# Patient Record
Sex: Female | Born: 1940 | Race: White | Hispanic: No | Marital: Single | Smoking: Current some day smoker
Health system: Southern US, Community
[De-identification: ages and names within clinical notes are randomized; demographics above are authoritative.]

## PROBLEM LIST (undated history)

## (undated) DIAGNOSIS — J449 Chronic obstructive pulmonary disease, unspecified: Secondary | ICD-10-CM

## (undated) HISTORY — PX: ABDOMINAL HYSTERECTOMY: SHX81

---

## 2010-04-18 ENCOUNTER — Emergency Department: Payer: Self-pay | Admitting: Emergency Medicine

## 2013-03-19 ENCOUNTER — Emergency Department: Payer: Self-pay | Admitting: Internal Medicine

## 2013-04-05 ENCOUNTER — Ambulatory Visit: Payer: Self-pay | Admitting: Ophthalmology

## 2013-04-05 DIAGNOSIS — I499 Cardiac arrhythmia, unspecified: Secondary | ICD-10-CM

## 2013-08-07 ENCOUNTER — Ambulatory Visit: Payer: Self-pay | Admitting: Ophthalmology

## 2013-08-24 ENCOUNTER — Ambulatory Visit: Payer: Self-pay | Admitting: Ophthalmology

## 2013-08-24 LAB — POTASSIUM: POTASSIUM: 3.9 mmol/L (ref 3.5–5.1)

## 2013-08-29 ENCOUNTER — Ambulatory Visit: Payer: Self-pay | Admitting: Ophthalmology

## 2014-10-06 NOTE — Op Note (Signed)
PATIENT NAME:  Michelle Shaffer, Michelle Shaffer MR#:  [id number] DATE OF BIRTH:  [birth year]  DATE OF PROCEDURE:  08/07/2013  PREOPERATIVE DIAGNOSIS: Visually significant cataract of the right eye.   POSTOPERATIVE DIAGNOSIS: Visually significant cataract of the right eye.   OPERATIVE PROCEDURE: Cataract extraction by phacoemulsification with implant of intraocular lens to right eye.   SURGEON: Galen ManilaWilliam Suresh Audi, MD.   ANESTHESIA:  1. Managed anesthesia care.  2. Topical tetracaine drops followed by 2% Xylocaine jelly applied in the preoperative holding area.   COMPLICATIONS: None.   TECHNIQUE:  Stop and chop.   DESCRIPTION OF PROCEDURE: The patient was examined and consented in the preoperative holding area where the aforementioned topical anesthesia was applied to the right eye and then brought back to the Operating Room where the right eye was prepped and draped in the usual sterile ophthalmic fashion and a lid speculum was placed. A paracentesis was created with the side port blade and the anterior chamber was filled with viscoelastic. A near clear corneal incision was performed with the steel keratome. A continuous curvilinear capsulorrhexis was performed with a cystotome followed by the capsulorrhexis forceps. Hydrodissection and hydrodelineation were carried out with BSS on a blunt cannula. The lens was removed in a stop and chop technique and the remaining cortical material was removed with the irrigation-aspiration handpiece. The capsular bag was inflated with viscoelastic and the Tecnis ZCB00 23.0-diopter lens, serial number [id number] was placed in the capsular bag without complication. The remaining viscoelastic was removed from the eye with the irrigation-aspiration handpiece. The wounds were hydrated. The anterior chamber was flushed with Miostat and the eye was inflated to physiologic pressure. 0.1 mL of cefuroxime concentration 10 mg/mL was placed in the anterior chamber. The wounds were found to be  water tight. The eye was dressed with Vigamox. The patient was given protective glasses to wear throughout the day and a shield with which to sleep tonight. The patient was also given drops with which to begin a drop regimen today and will follow-up with me in one day.    ____________________________ Jerilee FieldWilliam L. Lovina Zuver, MD wlp:sb D: 08/07/2013 11:39:43 ET T: 08/07/2013 12:21:42 ET JOB#: [id number]  cc: Iyani Dresner L. Nozomi Mettler, MD, <Dictator> Jerilee FieldWILLIAM L Zendayah Hardgrave MD ELECTRONICALLY SIGNED 08/11/2013 16:10

## 2014-10-06 NOTE — Op Note (Signed)
PATIENT NAME:  Michelle CaulWHITE, Haifa R MR#:  [id number] DATE OF BIRTH:  1941-04-13  DATE OF PROCEDURE:  08/29/2013  PREOPERATIVE DIAGNOSIS: Visually significant cataract of the left eye.   POSTOPERATIVE DIAGNOSIS: Visually significant cataract of the left eye.   OPERATIVE PROCEDURE: Cataract extraction by phacoemulsification with implant of intraocular lens to the left eye.   SURGEON: Galen ManilaWilliam Elizjah Noblet, MD.   ANESTHESIA:  1. Managed anesthesia care.  2. Topical tetracaine drops followed by 2% Xylocaine jelly applied in the preoperative holding area.   COMPLICATIONS: None.   TECHNIQUE:  Stop and chop.    DESCRIPTION OF PROCEDURE: The patient was examined and consented in the preoperative holding area where the aforementioned topical anesthesia was applied to the left eye and then brought back to the Operating Room where the left eye was prepped and draped in the usual sterile ophthalmic fashion and a lid speculum was placed. A paracentesis was created with the side port blade and the anterior chamber was filled with viscoelastic. A near clear corneal incision was performed with the steel keratome. A continuous curvilinear capsulorrhexis was performed with a cystotome followed by the capsulorrhexis forceps. Hydrodissection and hydrodelineation were carried out with BSS on a blunt cannula. The lens was removed in a stop and chop technique and the remaining cortical material was removed with the irrigation-aspiration handpiece. The capsular bag was inflated with viscoelastic and the Tecnis ZCB00 23.5-diopter lens, serial number [id number] was placed in the capsular bag without complication. The remaining viscoelastic was removed from the eye with the irrigation-aspiration handpiece. The wounds were hydrated. The anterior chamber was flushed with Miostat and the eye was inflated to physiologic pressure. 0.1 mL of cefuroxime concentration 10 mg/mL was placed in the anterior chamber. The wounds were found to be  water tight. The eye was dressed with Vigamox. The patient was given protective glasses to wear throughout the day and a shield with which to sleep tonight. The patient was also given drops with which to begin a drop regimen today and will follow-up with me in one day.   ____________________________ Jerilee FieldWilliam L. Ashlyne Olenick, MD wlp:gb D: 08/29/2013 22:47:42 ET T: 08/30/2013 04:59:23 ET JOB#: [id number]  cc: Wallace Gappa L. Zanaria Morell, MD, <Dictator> Jerilee FieldWILLIAM L Valmai Vandenberghe MD ELECTRONICALLY SIGNED 08/31/2013 9:11

## 2015-01-20 IMAGING — CR DG ANKLE COMPLETE 3+V*L*
1 series · 5 of 5 positions shown · non-contrast
Comparison: none

REASON FOR EXAM: Left ankle pain
COMMENTS:

PROCEDURE:     DXR - DXR ANKLE LEFT COMPLETE  - March 19, 2013  [DATE]
RESULT:     History: Pain.
Comparison Study: No prior. Soft tissue structures are normal. Diffuse
osteopenia. No acute bony abnormality.

[Series 4: x ankle ap left · 0.14mm/px · 5 of 5 slices shown]
[im 1/5]
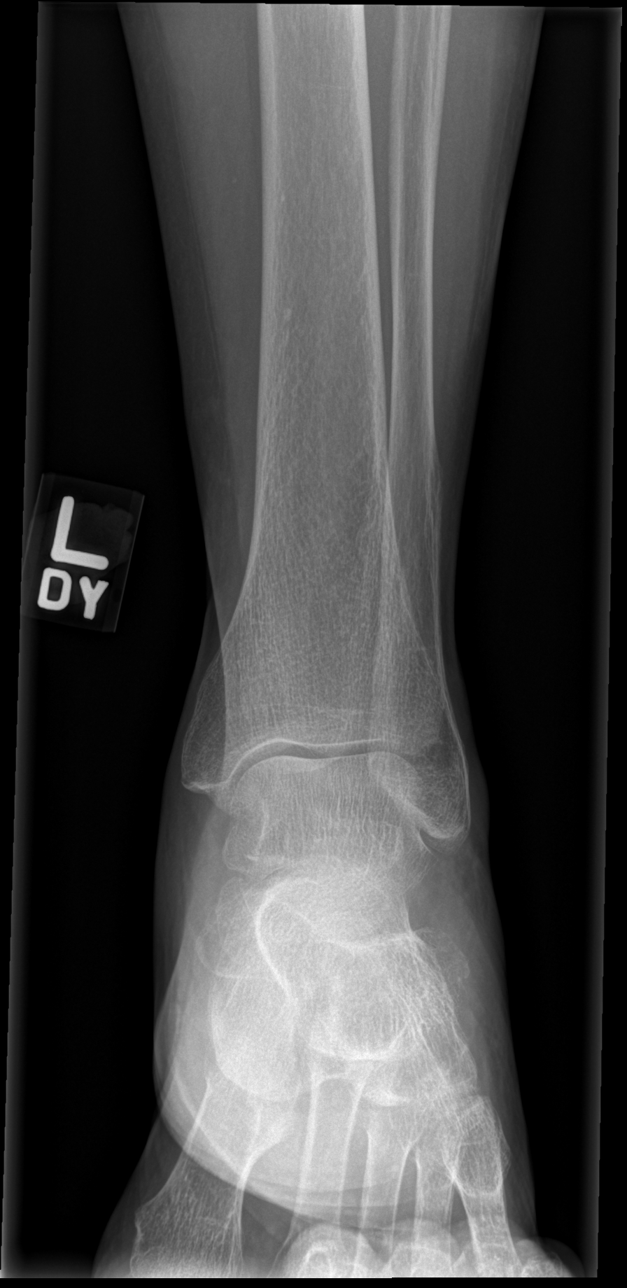
[im 2/5]
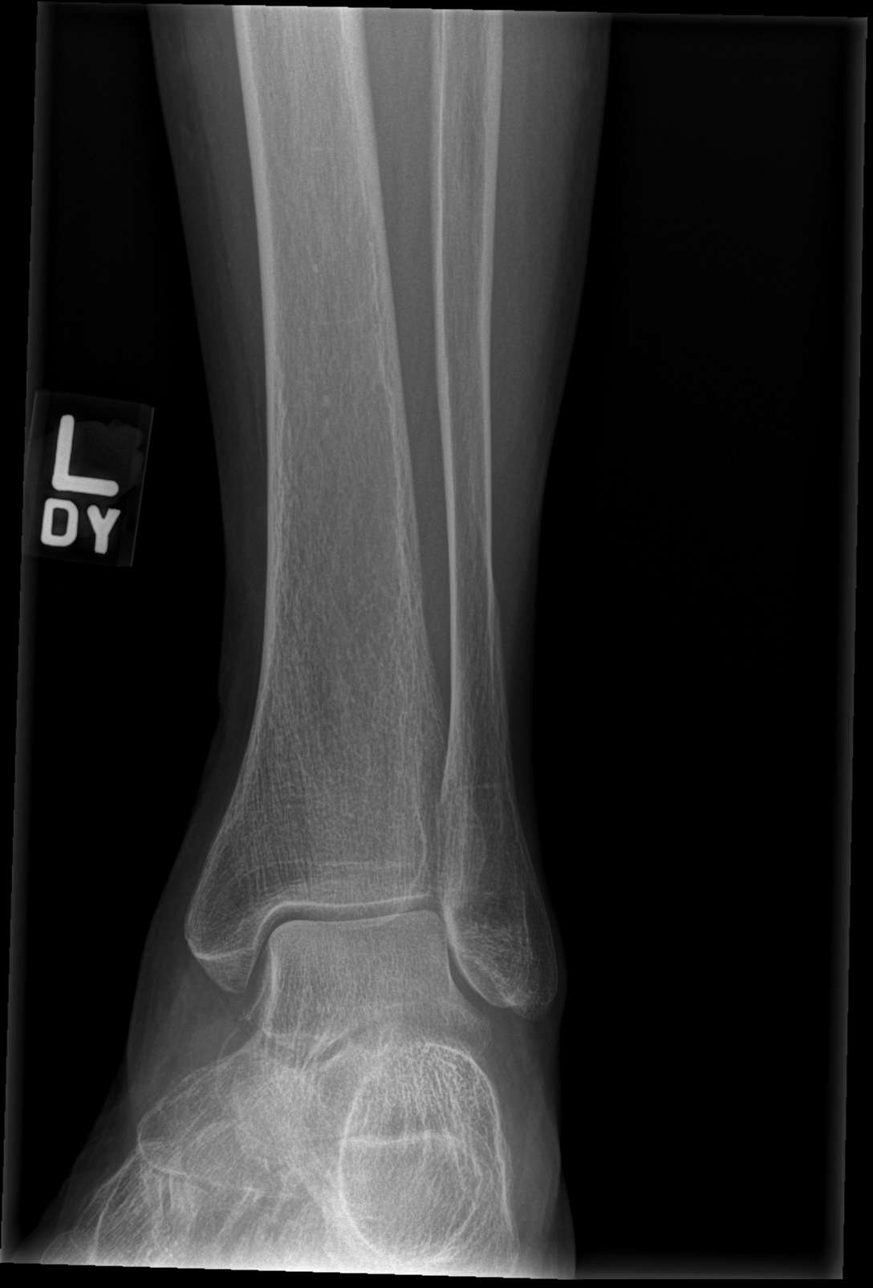
[im 3/5]
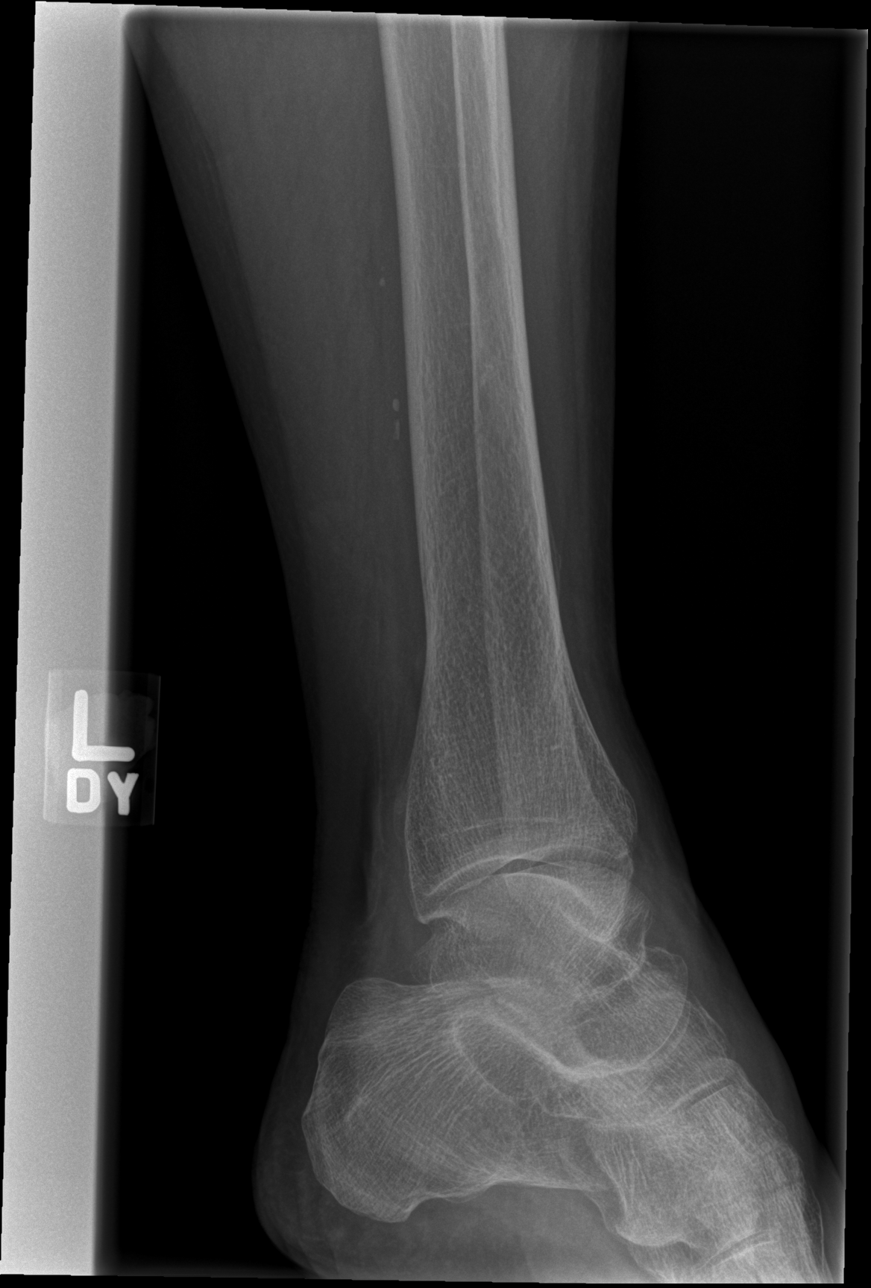
[im 4/5]
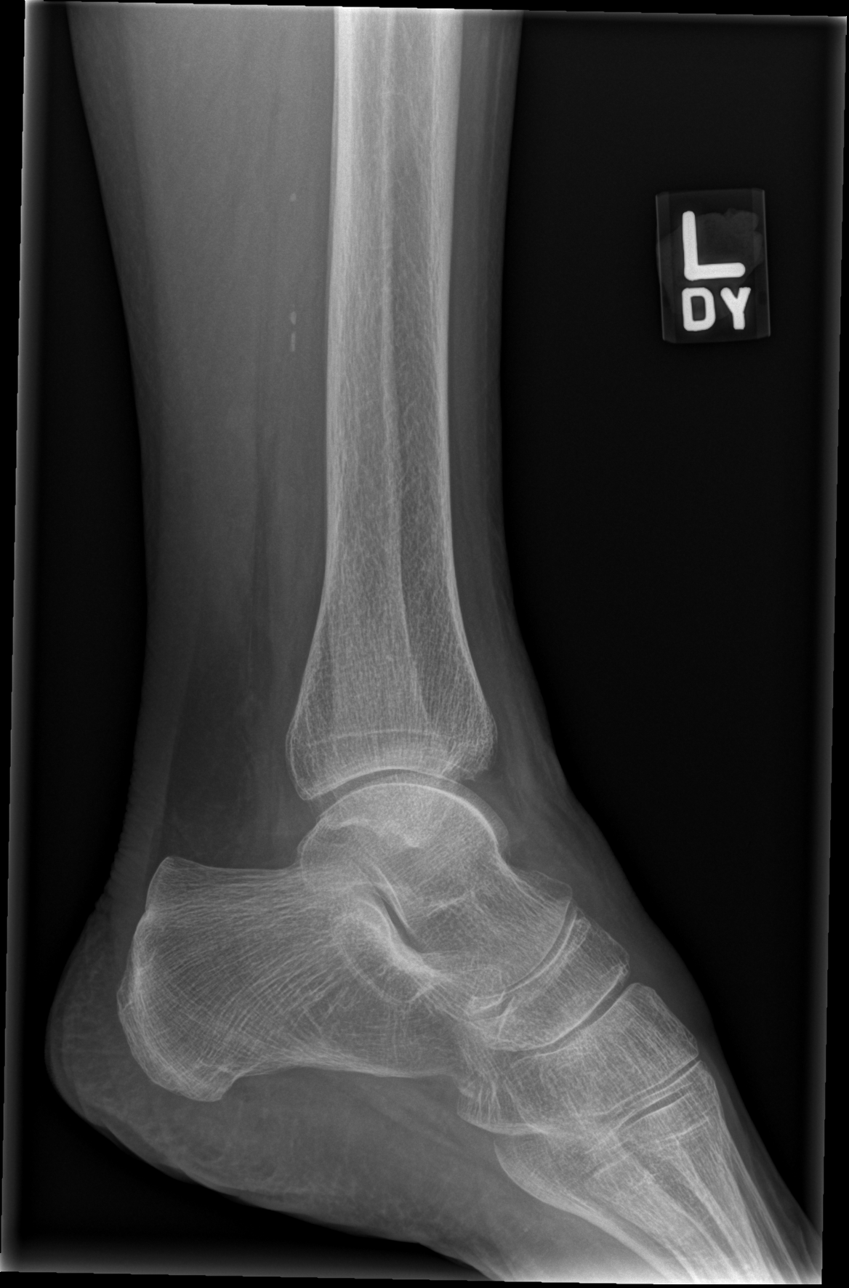
[im 5/5]
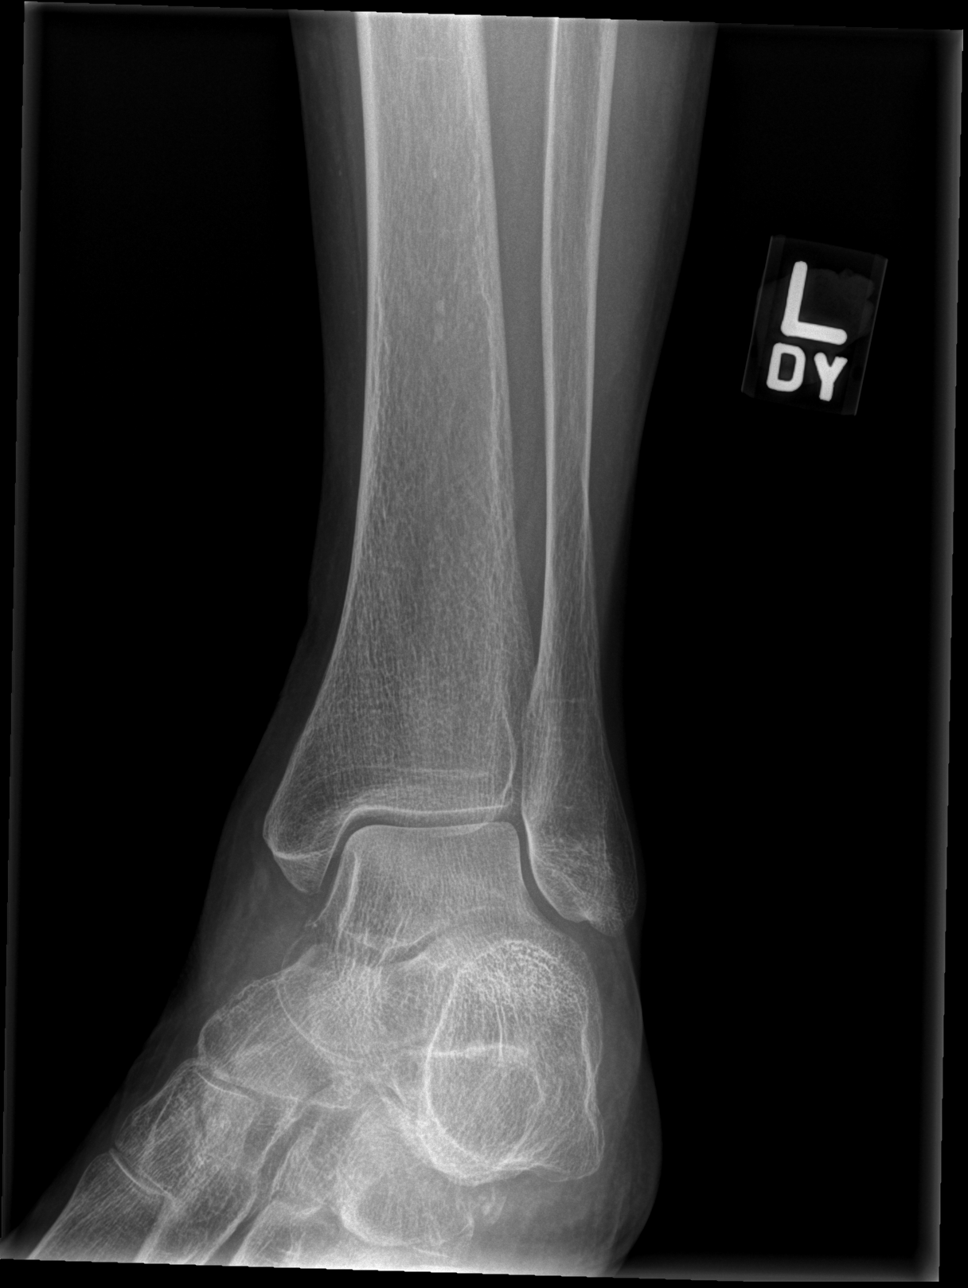

[5 of 5 positions shown; findings below may reference images not displayed]

IMPRESSION: No acute abnormality. Diffuse osteopenia.

## 2016-11-23 ENCOUNTER — Ambulatory Visit: Payer: Self-pay | Admitting: Podiatry

## 2016-12-03 ENCOUNTER — Ambulatory Visit: Payer: Self-pay | Admitting: Podiatry

## 2016-12-21 ENCOUNTER — Ambulatory Visit: Payer: Self-pay | Admitting: Podiatry

## 2016-12-24 ENCOUNTER — Ambulatory Visit (INDEPENDENT_AMBULATORY_CARE_PROVIDER_SITE_OTHER): Payer: Medicare PPO | Admitting: Podiatry

## 2016-12-24 ENCOUNTER — Encounter: Payer: Self-pay | Admitting: Podiatry

## 2016-12-24 VITALS — BP 171/90 | HR 81

## 2016-12-24 DIAGNOSIS — Q828 Other specified congenital malformations of skin: Secondary | ICD-10-CM

## 2016-12-24 DIAGNOSIS — M79676 Pain in unspecified toe(s): Secondary | ICD-10-CM

## 2016-12-24 DIAGNOSIS — B351 Tinea unguium: Secondary | ICD-10-CM

## 2016-12-24 NOTE — Progress Notes (Signed)
   Subjective:    Patient ID: Michelle Shaffer, female    DOB: 1940-08-06, 76 y.o.   MRN: [id number]  HPI this patient presents the office with chief complaint of painful long nails as well as a painful callus on the bottom of her right forefoot.  She says she was referred to this office by her medical doctor for tear of her feet.  She says the nails and the callus is painful walking and wearing her shoes.  She presents the office today for preventative foot care services.    Review of Systems  Eyes: Positive for redness and itching.  Cardiovascular: Positive for leg swelling.       Calf pain with walking  Musculoskeletal: Positive for back pain and gait problem.       Joint pain  Skin:       Change in nails       Objective:   Physical Exam GENERAL APPEARANCE: Alert, conversant. Appropriately groomed. No acute distress.  VASCULAR: Pedal pulses are  [facility].  Capillary refill time is immediate to all digits,  Normal temperature gradient.  Digital hair growth is present bilateral  NEUROLOGIC: sensation is normal to 5.07 monofilament at 5/5 sites bilateral.  Light touch is intact bilateral, Muscle strength normal.  MUSCULOSKELETAL: acceptable muscle strength, tone and stability bilateral.  Intrinsic muscluature intact bilateral.  Rectus appearance of foot and digits noted bilateral.  NAILS  thick disfigured discolored nails with subungual debris noted 10 DERMATOLOGIC: skin color, texture, and turgor are within normal limits.  No preulcerative lesions or ulcers  are seen, no interdigital maceration noted.  No open lesions present.   No drainage noted. Porokeratosis sub 5th right.,        Assessment & Plan:  Onychomycosis  Porokeratosis right foot.   IE  Debride nails.  Debride porokeratosis.  RTC 3 months.   Helane GuntherGregory Alvino Lechuga DPM

## 2019-12-13 ENCOUNTER — Ambulatory Visit: Payer: Self-pay

## 2020-02-12 ENCOUNTER — Ambulatory Visit: Payer: Medicare Other | Attending: Internal Medicine

## 2020-02-12 DIAGNOSIS — Z23 Encounter for immunization: Secondary | ICD-10-CM

## 2020-02-12 NOTE — Progress Notes (Signed)
   Covid-19 Vaccination Clinic  Name:  Michelle Shaffer    MRN: [id number] DOB: 1941/03/09  02/12/2020  Michelle Shaffer was observed post Covid-19 immunization for 15 minutes without incident. She was provided with Vaccine Information Sheet and instruction to access the V-Safe system.   Michelle Shaffer was instructed to call 911 with any severe reactions post vaccine: Marland Kitchen Difficulty breathing  . Swelling of face and throat  . A fast heartbeat  . A bad rash all over body  . Dizziness and weakness

## 2022-03-26 ENCOUNTER — Emergency Department: Payer: Medicare Other

## 2022-03-26 ENCOUNTER — Other Ambulatory Visit: Payer: Self-pay

## 2022-03-26 ENCOUNTER — Encounter: Payer: Self-pay | Admitting: Emergency Medicine

## 2022-03-26 ENCOUNTER — Inpatient Hospital Stay
Admission: EM | Admit: 2022-03-26 | Discharge: 2022-03-31 | DRG: 189 | Disposition: A | Discharge status: Home Health | Payer: Medicare Other | Attending: Internal Medicine | Admitting: Internal Medicine

## 2022-03-26 DIAGNOSIS — Z90710 Acquired absence of both cervix and uterus: Secondary | ICD-10-CM

## 2022-03-26 DIAGNOSIS — D72829 Elevated white blood cell count, unspecified: Secondary | ICD-10-CM | POA: Diagnosis present

## 2022-03-26 DIAGNOSIS — Z9981 Dependence on supplemental oxygen: Secondary | ICD-10-CM

## 2022-03-26 DIAGNOSIS — J441 Chronic obstructive pulmonary disease with (acute) exacerbation: Secondary | ICD-10-CM | POA: Diagnosis not present

## 2022-03-26 DIAGNOSIS — Z886 Allergy status to analgesic agent status: Secondary | ICD-10-CM

## 2022-03-26 DIAGNOSIS — K219 Gastro-esophageal reflux disease without esophagitis: Secondary | ICD-10-CM | POA: Diagnosis present

## 2022-03-26 DIAGNOSIS — F419 Anxiety disorder, unspecified: Secondary | ICD-10-CM | POA: Diagnosis present

## 2022-03-26 DIAGNOSIS — Z79899 Other long term (current) drug therapy: Secondary | ICD-10-CM

## 2022-03-26 DIAGNOSIS — J9621 Acute and chronic respiratory failure with hypoxia: Principal | ICD-10-CM | POA: Diagnosis present

## 2022-03-26 DIAGNOSIS — F17210 Nicotine dependence, cigarettes, uncomplicated: Secondary | ICD-10-CM | POA: Diagnosis present

## 2022-03-26 DIAGNOSIS — J431 Panlobular emphysema: Secondary | ICD-10-CM | POA: Diagnosis present

## 2022-03-26 DIAGNOSIS — E669 Obesity, unspecified: Secondary | ICD-10-CM | POA: Diagnosis present

## 2022-03-26 DIAGNOSIS — F17200 Nicotine dependence, unspecified, uncomplicated: Secondary | ICD-10-CM | POA: Diagnosis present

## 2022-03-26 DIAGNOSIS — G629 Polyneuropathy, unspecified: Secondary | ICD-10-CM | POA: Diagnosis present

## 2022-03-26 DIAGNOSIS — J9601 Acute respiratory failure with hypoxia: Secondary | ICD-10-CM

## 2022-03-26 DIAGNOSIS — Z6830 Body mass index (BMI) 30.0-30.9, adult: Secondary | ICD-10-CM

## 2022-03-26 DIAGNOSIS — F32A Depression, unspecified: Secondary | ICD-10-CM | POA: Diagnosis present

## 2022-03-26 DIAGNOSIS — R000 Tachycardia, unspecified: Secondary | ICD-10-CM | POA: Diagnosis present

## 2022-03-26 DIAGNOSIS — Z20822 Contact with and (suspected) exposure to covid-19: Secondary | ICD-10-CM | POA: Diagnosis present

## 2022-03-26 DIAGNOSIS — I10 Essential (primary) hypertension: Secondary | ICD-10-CM | POA: Diagnosis present

## 2022-03-26 HISTORY — DX: Chronic obstructive pulmonary disease, unspecified: J44.9

## 2022-03-26 LAB — URINALYSIS, ROUTINE W REFLEX MICROSCOPIC
Bacteria, UA: NONE SEEN
Bilirubin Urine: NEGATIVE
Glucose, UA: NEGATIVE mg/dL
Hgb urine dipstick: NEGATIVE
Ketones, ur: 5 mg/dL — AB
Leukocytes,Ua: NEGATIVE
Nitrite: NEGATIVE
Protein, ur: >=300 mg/dL — AB
Specific Gravity, Urine: 1.034 — ABNORMAL HIGH (ref 1.005–1.030)
pH: 5 (ref 5.0–8.0)

## 2022-03-26 LAB — CBC WITH DIFFERENTIAL/PLATELET
Abs Immature Granulocytes: 0.03 10*3/uL (ref 0.00–0.07)
Basophils Absolute: 0.1 10*3/uL (ref 0.0–0.1)
Basophils Relative: 1 %
Eosinophils Absolute: 0.6 10*3/uL — ABNORMAL HIGH (ref 0.0–0.5)
Eosinophils Relative: 4 %
HCT: 50.9 % — ABNORMAL HIGH (ref 36.0–46.0)
Hemoglobin: 16.1 g/dL — ABNORMAL HIGH (ref 12.0–15.0)
Immature Granulocytes: 0 %
Lymphocytes Relative: 12 %
Lymphs Abs: 1.7 10*3/uL (ref 0.7–4.0)
MCH: 29.6 pg (ref 26.0–34.0)
MCHC: 31.6 g/dL (ref 30.0–36.0)
MCV: 93.6 fL (ref 80.0–100.0)
Monocytes Absolute: 1 10*3/uL (ref 0.1–1.0)
Monocytes Relative: 6 %
Neutro Abs: 11.5 10*3/uL — ABNORMAL HIGH (ref 1.7–7.7)
Neutrophils Relative %: 77 %
Platelets: 265 10*3/uL (ref 150–400)
RBC: 5.44 MIL/uL — ABNORMAL HIGH (ref 3.87–5.11)
RDW: 14.6 % (ref 11.5–15.5)
WBC: 14.9 10*3/uL — ABNORMAL HIGH (ref 4.0–10.5)
nRBC: 0 % (ref 0.0–0.2)

## 2022-03-26 LAB — RESP PANEL BY RT-PCR (FLU A&B, COVID) ARPGX2
Influenza A by PCR: NEGATIVE
Influenza B by PCR: NEGATIVE
SARS Coronavirus 2 by RT PCR: NEGATIVE

## 2022-03-26 LAB — COMPREHENSIVE METABOLIC PANEL
ALT: 11 U/L (ref 0–44)
AST: 25 U/L (ref 15–41)
Albumin: 3.9 g/dL (ref 3.5–5.0)
Alkaline Phosphatase: 38 U/L (ref 38–126)
Anion gap: 8 (ref 5–15)
BUN: 9 mg/dL (ref 8–23)
CO2: 22 mmol/L (ref 22–32)
Calcium: 8.6 mg/dL — ABNORMAL LOW (ref 8.9–10.3)
Chloride: 109 mmol/L (ref 98–111)
Creatinine, Ser: 0.84 mg/dL (ref 0.44–1.00)
GFR, Estimated: >60 mL/min (ref >60)
Glucose, Bld: 160 mg/dL — ABNORMAL HIGH (ref 70–99)
Potassium: 3.7 mmol/L (ref 3.5–5.1)
Sodium: 139 mmol/L (ref 135–145)
Total Bilirubin: 1 mg/dL (ref 0.3–1.2)
Total Protein: 7.1 g/dL (ref 6.5–8.1)

## 2022-03-26 LAB — TROPONIN I (HIGH SENSITIVITY)
Troponin I (High Sensitivity): 5 ng/L (ref <18)
Troponin I (High Sensitivity): 6 ng/L (ref <18)

## 2022-03-26 LAB — PROCALCITONIN: Procalcitonin: <0.1 ng/mL

## 2022-03-26 LAB — MAGNESIUM: Magnesium: 2 mg/dL (ref 1.7–2.4)

## 2022-03-26 LAB — BRAIN NATRIURETIC PEPTIDE: B Natriuretic Peptide: 27.8 pg/mL (ref 0.0–100.0)

## 2022-03-26 MED ORDER — CLONAZEPAM 0.5 MG PO TABS
0.5000 mg | ORAL_TABLET | Freq: Three times a day (TID) | ORAL | Status: DC | PRN
  Administered 2022-03-26 – 2022-03-29 (×5): 0.5 mg via ORAL
  Filled 2022-03-26 (×5): qty 1

## 2022-03-26 MED ORDER — AMLODIPINE BESYLATE 5 MG PO TABS
5.0000 mg | ORAL_TABLET | Freq: Every day | ORAL | Status: DC
  Administered 2022-03-26 – 2022-03-31 (×6): 5 mg via ORAL
  Filled 2022-03-26 (×6): qty 1

## 2022-03-26 MED ORDER — PANTOPRAZOLE SODIUM 40 MG PO TBEC
40.0000 mg | DELAYED_RELEASE_TABLET | Freq: Every day | ORAL | Status: DC
  Administered 2022-03-26 – 2022-03-31 (×6): 40 mg via ORAL
  Filled 2022-03-26 (×6): qty 1

## 2022-03-26 MED ORDER — ALBUTEROL SULFATE (2.5 MG/3ML) 0.083% IN NEBU: 2.5000 mg | INHALATION_SOLUTION | RESPIRATORY_TRACT | Status: DC | PRN

## 2022-03-26 MED ORDER — ACETAMINOPHEN 325 MG PO TABS
650.0000 mg | ORAL_TABLET | Freq: Four times a day (QID) | ORAL | Status: DC | PRN
  Administered 2022-03-28 – 2022-03-29 (×3): 650 mg via ORAL
  Filled 2022-03-26 (×4): qty 2

## 2022-03-26 MED ORDER — METHYLPREDNISOLONE SODIUM SUCC 40 MG IJ SOLR
40.0000 mg | Freq: Two times a day (BID) | INTRAMUSCULAR | Status: AC
  Administered 2022-03-26 (×2): 40 mg via INTRAVENOUS
  Filled 2022-03-26 (×2): qty 1

## 2022-03-26 MED ORDER — ONDANSETRON HCL 4 MG PO TABS: 4.0000 mg | ORAL_TABLET | Freq: Four times a day (QID) | ORAL | Status: DC | PRN

## 2022-03-26 MED ORDER — NICOTINE 14 MG/24HR TD PT24
14.0000 mg | MEDICATED_PATCH | Freq: Every day | TRANSDERMAL | Status: DC
  Administered 2022-03-26 – 2022-03-30 (×5): 14 mg via TRANSDERMAL
  Filled 2022-03-26 (×6): qty 1

## 2022-03-26 MED ORDER — LISINOPRIL 20 MG PO TABS
20.0000 mg | ORAL_TABLET | Freq: Every day | ORAL | Status: DC
  Administered 2022-03-26 – 2022-03-31 (×6): 20 mg via ORAL
  Filled 2022-03-26: qty 2
  Filled 2022-03-26 (×4): qty 1

## 2022-03-26 MED ORDER — METHYLPREDNISOLONE SODIUM SUCC 125 MG IJ SOLR: 125.0000 mg | Freq: Once | INTRAMUSCULAR | Status: DC

## 2022-03-26 MED ORDER — ACETAMINOPHEN 650 MG RE SUPP: 650.0000 mg | Freq: Four times a day (QID) | RECTAL | Status: DC | PRN

## 2022-03-26 MED ORDER — IPRATROPIUM-ALBUTEROL 0.5-2.5 (3) MG/3ML IN SOLN
3.0000 mL | Freq: Four times a day (QID) | RESPIRATORY_TRACT | Status: DC
  Administered 2022-03-26 – 2022-03-31 (×23): 3 mL via RESPIRATORY_TRACT
  Filled 2022-03-26 (×22): qty 3

## 2022-03-26 MED ORDER — TRAZODONE HCL 50 MG PO TABS
25.0000 mg | ORAL_TABLET | Freq: Every day | ORAL | Status: DC
  Administered 2022-03-26 – 2022-03-30 (×5): 25 mg via ORAL
  Filled 2022-03-26 (×5): qty 1

## 2022-03-26 MED ORDER — GABAPENTIN 300 MG PO CAPS
300.0000 mg | ORAL_CAPSULE | Freq: Three times a day (TID) | ORAL | Status: DC
  Administered 2022-03-26 – 2022-03-31 (×15): 300 mg via ORAL
  Filled 2022-03-26 (×15): qty 1

## 2022-03-26 MED ORDER — PREDNISONE 20 MG PO TABS
40.0000 mg | ORAL_TABLET | Freq: Every day | ORAL | Status: DC
  Administered 2022-03-27: 40 mg via ORAL
  Filled 2022-03-26: qty 2

## 2022-03-26 MED ORDER — IPRATROPIUM-ALBUTEROL 0.5-2.5 (3) MG/3ML IN SOLN
6.0000 mL | Freq: Once | RESPIRATORY_TRACT | Status: AC
  Administered 2022-03-26: 6 mL via RESPIRATORY_TRACT
  Filled 2022-03-26: qty 6

## 2022-03-26 MED ORDER — ENOXAPARIN SODIUM 40 MG/0.4ML IJ SOSY
40.0000 mg | PREFILLED_SYRINGE | INTRAMUSCULAR | Status: DC
  Administered 2022-03-26 – 2022-03-31 (×6): 40 mg via SUBCUTANEOUS
  Filled 2022-03-26 (×6): qty 0.4

## 2022-03-26 MED ORDER — ONDANSETRON HCL 4 MG/2ML IJ SOLN: 4.0000 mg | Freq: Four times a day (QID) | INTRAMUSCULAR | Status: DC | PRN

## 2022-03-26 NOTE — Assessment & Plan Note (Signed)
Patient normally takes nightly Klonopin.  Given IV steroids, she is feeling quite anxious over change Klonopin to 3 times daily as needed.

## 2022-03-26 NOTE — ED Notes (Signed)
Messaged the physician ref the pt visibly shaking and her seeming very anxious.

## 2022-03-26 NOTE — ED Provider Notes (Signed)
[facility] Provider Note    Event Date/Time   First MD Initiated Contact with Patient 03/26/22 [phone]     (approximate)   History   Respiratory Distress (/)   HPI  Michelle Shaffer is a 81 y.o. female with history of COPD on 3 L chronically who presents to the emergency department who presents emergency department with increasing shortness of breath, wheezing, productive cough with yellow sputum for the past day.  Was hypoxic on her normal 3 L with EMS.  Was given DuoNebs and Solu-Medrol.  No fevers.  No chest pain.  States increased shortness of breath with exertion.   History provided by patient and family, EMS.    Past Medical History:  Diagnosis Date   COPD (chronic obstructive pulmonary disease) (HCC)     Past Surgical History:  Procedure Laterality Date   ABDOMINAL HYSTERECTOMY      MEDICATIONS:  Prior to Admission medications   Medication Sig Start Date End Date Taking? Authorizing Provider  Cholecalciferol (VITAMIN D3) 50000 units TABS Take by mouth once a week.    [provider]  FLUoxetine (PROZAC) 40 MG capsule Take 40 mg by mouth. 11/10/16 11/10/17  [provider]    Physical Exam   Triage Vital Signs: ED Triage Vitals  Enc Vitals Group     BP 03/26/22 0120 133/68     Pulse Rate 03/26/22 0120 (!) 113     Resp 03/26/22 0120 (!) 28     Temp 03/26/22 0149 98.7 F (37.1 C)     Temp Source 03/26/22 0149 Oral     SpO2 03/26/22 0120 97 %     Weight 03/26/22 0123 166 lb (75.3 kg)     Height 03/26/22 0123 5\' 2"  (1.575 m)     Head Circumference --      Peak Flow --      Pain Score 03/26/22 0123 0     Pain Loc --      Pain Edu? --      Excl. in GC? --     Most recent vital signs: Vitals:   03/26/22 0149 03/26/22 0230  BP:  139/86  Pulse:  (!) 111  Resp:  (!) 23  Temp: 98.7 F (37.1 C)   SpO2:  93%    CONSTITUTIONAL: Alert and oriented and responds appropriately to questions.  Elderly, chronically  ill-appearing HEAD: Normocephalic, atraumatic EYES: Conjunctivae clear, pupils appear equal, sclera nonicteric ENT: normal nose; moist mucous membranes NECK: Supple, normal ROM CARD: Regular and tachycardic; S1 and S2 appreciated; no murmurs, no clicks, no rubs, no gallops RESP: Patient has tachypnea, diffuse inspiratory and expiratory wheezing, speaking in short sentences, 100% on nonrebreather, no rhonchi or rales ABD/GI: Normal bowel sounds; non-distended; soft, non-tender, no rebound, no guarding, no peritoneal signs BACK: The back appears normal EXT: Normal ROM in all joints; no deformity noted, no edema; no cyanosis, no calf tenderness or calf swelling SKIN: Normal color for age and race; warm; no rash on exposed skin NEURO: Moves all extremities equally, normal speech PSYCH: The patient's mood and manner are appropriate.   ED Results / Procedures / Treatments   LABS: (all labs ordered are listed, but only abnormal results are displayed) Labs Reviewed  CBC WITH DIFFERENTIAL/PLATELET - Abnormal; Notable for the following components:      Result Value   WBC 14.9 (*)    RBC 5.44 (*)    Hemoglobin 16.1 (*)    HCT 50.9 (*)  Neutro Abs 11.5 (*)    Eosinophils Absolute 0.6 (*)    All other components within normal limits  COMPREHENSIVE METABOLIC PANEL - Abnormal; Notable for the following components:   Glucose, Bld 160 (*)    Calcium 8.6 (*)    All other components within normal limits  RESP PANEL BY RT-PCR (FLU A&B, COVID) ARPGX2  BRAIN NATRIURETIC PEPTIDE  MAGNESIUM  URINALYSIS, ROUTINE W REFLEX MICROSCOPIC  PROCALCITONIN  BLOOD GAS, ARTERIAL  TROPONIN I (HIGH SENSITIVITY)  TROPONIN I (HIGH SENSITIVITY)     EKG:    Date: 03/26/2022 1:48 AM    Rate: 110  Rhythm: Sinus tachycardia  QRS Axis: normal  Intervals: normal  ST/T Wave abnormalities: normal  Conduction Disutrbances: none  Narrative Interpretation: Sinus tachycardia       RADIOLOGY: My personal  review and interpretation of imaging: Chest x-ray shows no pneumonia, edema or pneumothorax.  I have personally reviewed all radiology reports.   DG Chest Portable 1 View  Result Date: 03/26/2022 CLINICAL DATA:  COPD exacerbation. Respiratory distress. Current smoker. EXAM: PORTABLE CHEST 1 VIEW COMPARISON:  None Available. FINDINGS: Heart size and pulmonary vascularity are normal. Emphysematous changes and scattered fibrosis in the lungs. Peribronchial thickening suggesting chronic bronchitis. No airspace disease or consolidation in the lungs. No pleural effusions. No pneumothorax. Mediastinal contours appear intact. Calcification of the aorta. IMPRESSION: Emphysematous and chronic bronchitic changes in the lungs. No focal consolidation. Electronically Signed   By: Lucienne Capers M.D.   On: 03/26/2022 01:46     PROCEDURES:  Critical Care performed: Yes, see critical care procedure note(s)   CRITICAL CARE Performed by: Cyril Mourning Kevin Space   Total critical care time: 45 minutes  Critical care time was exclusive of separately billable procedures and treating other patients.  Critical care was necessary to treat or prevent imminent or life-threatening deterioration.  Critical care was time spent personally by me on the following activities: development of treatment plan with patient and/or surrogate as well as nursing, discussions with consultants, evaluation of patient's response to treatment, examination of patient, obtaining history from patient or surrogate, ordering and performing treatments and interventions, ordering and review of laboratory studies, ordering and review of radiographic studies, pulse oximetry and re-evaluation of patient's condition.   Marland Kitchen1-3 Lead EKG Interpretation  Performed by: Captain Blucher, Delice Bison, DO Authorized by: Michaiah Maiden, Delice Bison, DO     Interpretation: abnormal     ECG rate:  113   ECG rate assessment: tachycardic     Rhythm: sinus tachycardia     Ectopy: none      Conduction: normal       IMPRESSION / MDM / ASSESSMENT AND PLAN / ED COURSE  I reviewed the triage vital signs and the nursing notes.    Patient here with COPD exacerbation, increased oxygen requirement compared to baseline, productive cough.  The patient is on the cardiac monitor to evaluate for evidence of arrhythmia and/or significant heart rate changes.   DIFFERENTIAL DIAGNOSIS (includes but not limited to):   COPD exacerbation, pneumonia, COVID, influenza, less likely CHF, ACS, PE, dissection, pneumothorax   Patient's presentation is most consistent with acute presentation with potential threat to life or bodily function.   PLAN: We will obtain CBC, BMP, troponin, BNP, chest x-ray, COVID swab.  She is already received Solu-Medrol with EMS and breathing treatments.  We will continue DuoNebs but anticipate admission.  Will obtain ABG.   MEDICATIONS GIVEN IN ED: Medications  ipratropium-albuterol (DUONEB) 0.5-2.5 (3) MG/3ML nebulizer solution 6  mL (6 mLs Nebulization Given 03/26/22 0143)     ED COURSE: Labs show leukocytosis of 14,000.  Normal electrolytes and renal function.  BNP is normal.  Troponin negative.  Chest x-ray reviewed and interpreted by myself and the radiologist and shows bronchitic changes but no infiltrate.  COVID, procalcitonin pending.  Will discuss with hospitalist for admission.   CONSULTS:  Consulted and discussed patient's case with hospitalist, Dr. Damita Dunnings.  I have recommended admission and consulting physician agrees and will place admission orders.  Patient (and family if present) agree with this plan.   I reviewed all nursing notes, vitals, pertinent previous records.  All labs, EKGs, imaging ordered have been independently reviewed and interpreted by myself.    OUTSIDE RECORDS REVIEWED: Reviewed patient's last internal medicine office visit on 03/13/2022.       FINAL CLINICAL IMPRESSION(S) / ED DIAGNOSES   Final diagnoses:  COPD  exacerbation ([place])  Acute respiratory failure with hypoxia ([place])     Rx / DC Orders   ED Discharge Orders     None        Note:  This document was prepared using Dragon voice recognition software and may include unintentional dictation errors.   Aneisa Karren, Delice Bison, DO 03/26/22 [phone]

## 2022-03-26 NOTE — ED Notes (Signed)
Pt advised she was unable to urinate at this time. 

## 2022-03-26 NOTE — Assessment & Plan Note (Signed)
-   Continue home medications 

## 2022-03-26 NOTE — Hospital Course (Addendum)
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  Patient with productive cough and oxygen saturations noted to be at 71% on 3 L requiring nonrebreather.  Patient found to be quite dyspneic and wheezing and felt to be in COPD exacerbation.  She was given nebulizers and Solu-Medrol.  COVID and flu tests negative.  Chest x-ray noted emphysema, but no focal consolidation.  BNP and procalcitonin also normal.  Patient brought in for further evaluation- started on management for COPD exacerbation with Solu-Medrol, nebulizers for management acute exacerbation of COPD, acute on chronic hypoxic respiratory failure.

## 2022-03-26 NOTE — Plan of Care (Signed)
  Problem: Activity: Goal: Ability to tolerate increased activity will improve Outcome: Progressing Goal: Will verbalize the importance of balancing activity with adequate rest periods Outcome: Progressing   Problem: Respiratory: Goal: Ability to maintain a clear airway will improve Outcome: Progressing Goal: Levels of oxygenation will improve Outcome: Progressing Goal: Ability to maintain adequate ventilation will improve Outcome: Progressing   

## 2022-03-26 NOTE — ED Notes (Signed)
Pt walked to toilet with 2 assist.

## 2022-03-26 NOTE — H&P (Signed)
History and Physical    Patient: Michelle Shaffer LOV:[id number] DOB: 1941-01-10 DOA: 03/26/2022 DOS: the patient was seen and examined on 03/26/2022 PCP: Medicine, [place]  Patient coming from: Home  Chief Complaint:  Chief Complaint  Patient presents with   Respiratory Distress         HPI: Michelle Shaffer is a 81 y.o. female with medical history significant for HTN, tobacco use disorder and panlobular emphysema with chronic respiratory failure on home O2 at 3 L, who presents to the ED via EMS with respiratory distress that started a few hours prior to presentation, not improving with home nebulized bronchodilator treatments.  She has a cough productive of yellow phlegm.  She denies chest pain, fever or chills.  On arrival of EMS O2 sats was in the low 80s on 3 L dropping to 71% with exertion.  She required NRB to maintain sats in the mid 90s.  She was treated in route with 2 nebs and Solu-Medrol. ED course and data review: Tachycardic to 113 and tachypneic to 28 with O2 sat 97% on NRB.  Other vitals within normal limits patient speaking in 2 word sentences and using accessory muscles.  ABG on 2 L showed PO2 of 62.  Labs with WBC 15,000, glucose 160.  Troponin and BNP normal.  COVID and flu pending EKG, personally viewed and interpreted with sinus tachycardia at 110 chest x-ray showing emphysematous and chronic bronchitic changes in the lungs with no focal consolidation.  Patient treated with an additional DuoNeb but continued to have increased work of breathing.  Hospitalist consulted for admission.   Review of Systems: As mentioned in the history of present illness. All other systems reviewed and are negative.  Past Medical History:  Diagnosis Date   COPD (chronic obstructive pulmonary disease) ([place])    Past Surgical History:  Procedure Laterality Date   ABDOMINAL HYSTERECTOMY     Social History:  reports that she has been smoking cigarettes. She has never used smokeless  tobacco. She reports current alcohol use. She reports that she does not use drugs.  Allergies  Allergen Reactions   Aspirin     Stomach nausea    History reviewed. No pertinent family history.  Prior to Admission medications   Medication Sig Start Date End Date Taking? Authorizing Provider  Cholecalciferol (VITAMIN D3) 50000 units TABS Take by mouth once a week.    [provider]  FLUoxetine (PROZAC) 40 MG capsule Take 40 mg by mouth. 11/10/16 11/10/17  [provider]    Physical Exam: Vitals:   03/26/22 0123 03/26/22 0126 03/26/22 0149 03/26/22 0230  BP:    139/86  Pulse:    (!) 111  Resp:    (!) 23  Temp:   98.7 F (37.1 C)   TempSrc:   Oral   SpO2:  93%  93%  Weight: 75.3 kg     Height: 5\' 2"  (1.575 m)      Physical Exam Vitals and nursing note reviewed.  Constitutional:      General: She is in acute distress.  HENT:     Head: Normocephalic and atraumatic.  Cardiovascular:     Rate and Rhythm: Regular rhythm. Tachycardia present.     Heart sounds: Normal heart sounds.  Pulmonary:     Effort: Tachypnea and accessory muscle usage present.     Breath sounds: Wheezing and rhonchi present.     Comments: Tachypneic, speaking in 2-3 word sentences Abdominal:  Palpations: Abdomen is soft.     Tenderness: There is no abdominal tenderness.  Neurological:     Mental Status: Mental status is at baseline.     Labs on Admission: I have personally reviewed following labs and imaging studies  CBC: Recent Labs  Lab 03/26/22 0130  WBC 14.9*  NEUTROABS 11.5*  HGB 16.1*  HCT 50.9*  MCV 93.6  PLT 99991111   Basic Metabolic Panel: Recent Labs  Lab 03/26/22 0218  NA 139  K 3.7  CL 109  CO2 22  GLUCOSE 160*  BUN 9  CREATININE 0.84  CALCIUM 8.6*  MG 2.0   GFR: Estimated Creatinine Clearance: 48.2 mL/min (by C-G formula based on SCr of 0.84 mg/dL). Liver Function Tests: Recent Labs  Lab 03/26/22 0218  AST 25  ALT 11  ALKPHOS 38  BILITOT  1.0  PROT 7.1  ALBUMIN 3.9   No results for input(s): "LIPASE", "AMYLASE" in the last 168 hours. No results for input(s): "AMMONIA" in the last 168 hours. Coagulation Profile: No results for input(s): "INR", "PROTIME" in the last 168 hours. Cardiac Enzymes: No results for input(s): "CKTOTAL", "CKMB", "CKMBINDEX", "TROPONINI" in the last 168 hours. BNP (last 3 results) No results for input(s): "PROBNP" in the last 8760 hours. HbA1C: No results for input(s): "HGBA1C" in the last 72 hours. CBG: No results for input(s): "GLUCAP" in the last 168 hours. Lipid Profile: No results for input(s): "CHOL", "HDL", "LDLCALC", "TRIG", "CHOLHDL", "LDLDIRECT" in the last 72 hours. Thyroid Function Tests: No results for input(s): "TSH", "T4TOTAL", "FREET4", "T3FREE", "THYROIDAB" in the last 72 hours. Anemia Panel: No results for input(s): "VITAMINB12", "FOLATE", "FERRITIN", "TIBC", "IRON", "RETICCTPCT" in the last 72 hours. Urine analysis: No results found for: "COLORURINE", "APPEARANCEUR", "LABSPEC", "PHURINE", "GLUCOSEU", "HGBUR", "BILIRUBINUR", "KETONESUR", "PROTEINUR", "UROBILINOGEN", "NITRITE", "LEUKOCYTESUR"  Radiological Exams on Admission: DG Chest Portable 1 View  Result Date: 03/26/2022 CLINICAL DATA:  COPD exacerbation. Respiratory distress. Current smoker. EXAM: PORTABLE CHEST 1 VIEW COMPARISON:  None Available. FINDINGS: Heart size and pulmonary vascularity are normal. Emphysematous changes and scattered fibrosis in the lungs. Peribronchial thickening suggesting chronic bronchitis. No airspace disease or consolidation in the lungs. No pleural effusions. No pneumothorax. Mediastinal contours appear intact. Calcification of the aorta. IMPRESSION: Emphysematous and chronic bronchitic changes in the lungs. No focal consolidation. Electronically Signed   By: Lucienne Capers M.D.   On: 03/26/2022 01:46     Data Reviewed: Relevant notes from primary care and specialist visits, past discharge  summaries as available in EHR, including Care Everywhere. Prior diagnostic testing as pertinent to current admission diagnoses Updated medications and problem lists for reconciliation ED course, including vitals, labs, imaging, treatment and response to treatment Triage notes, nursing and pharmacy notes and ED provider's notes Notable results as noted in HPI   Assessment and Plan: * COPD with acute exacerbation ([place]) Acute on chronic respiratory failure with hypoxia Schedule and as needed nebulized bronchodilator treatment, IV steroids Patient requiring NRB to maintain sats in the mid 90s Continue supplemental oxygen to keep sats over 90% Antitussives, flutter valve, incentive spirometer and supportive care  Tobacco use disorder Nicotine patch        DVT prophylaxis: Lovenox  Consults: none  Advance Care Planning: full code  Family Communication: none  Disposition Plan: Back to previous home environment  Severity of Illness: The appropriate patient status for this patient is OBSERVATION. Observation status is judged to be reasonable and necessary in order to provide the required intensity of service to ensure the  patient's safety. The patient's presenting symptoms, physical exam findings, and initial radiographic and laboratory data in the context of their medical condition is felt to place them at decreased risk for further clinical deterioration. Furthermore, it is anticipated that the patient will be medically stable for discharge from the hospital within 2 midnights of admission.   Author: Athena Masse, MD 03/26/2022 3:35 AM  For on call review [email or web address].

## 2022-03-26 NOTE — Progress Notes (Signed)
[facility] Progress Note  Patient: Michelle Shaffer    UVO:[id number]  DOA: 03/26/2022    Date of Service: the patient was seen and examined on 03/26/2022  Brief hospital course: 81 year old female with past medical history of hypertension, tobacco abuse, obesity and 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  Patient with productive cough and oxygen saturations noted to be at 71% on 3 L requiring nonrebreather.  Patient found to be quite dyspneic and wheezing and felt to be in COPD exacerbation.  She was given nebulizers and Solu-Medrol.  COVID and flu tests negative.  Chest x-ray noted emphysema, but no focal consolidation.  BNP and procalcitonin also normal.  Patient brought in for further evaluation.  Assessment and Plan: Assessment and Plan: * COPD with acute exacerbation ([place]) Continue steroids and nebulizers.  Oxygen weaned down.  Still tight with significant wheezing.  Continue antitussives and flutter valve.  No evidence of infection, no need for antibiotics.  Acute on chronic respiratory failure with hypoxia.  Work on transitioning over steroids to p.o. taper.  Acute on chronic respiratory failure with hypoxia ([facility])-resolved as of 03/26/2022 Patient normally on 3 L nasal cannula.  Initially hypoxic and following treatment with steroid and nebulizers, now back down to 3 L.  See below.  Anxiety Patient normally takes nightly Klonopin.  Given IV steroids, she is feeling quite anxious over change Klonopin to 3 times daily as needed.  Essential (primary) hypertension Continue home medications  Depression Continue SSRI  Tobacco use disorder Nicotine patch  Obesity (BMI 30-39.9) Meets criteria BMI greater than 30       Body mass index is 30.36 kg/m.         Consultants: None  Procedures: None  Antimicrobials: None  Code Status: Full code   Subjective: Patient states breathing is about the same, not much improvement  Objective: Vital signs were reviewed and unremarkable. Vitals:   03/26/22 1408 03/26/22 1500  BP: (!) 160/136 (!) 153/66  Pulse: 100 (!) 102  Resp: (!) 23 (!) 23  Temp:    SpO2: 100% 96%   No intake or output data in the 24 hours ending 03/26/22 1521 Filed Weights   03/26/22 0123  Weight: 75.3 kg   Body mass index is 30.36 kg/m.  Exam:  General: Alert and oriented x3, mildly anxious HEENT: Normocephalic, atraumatic, mucous membranes are moist Cardiovascular: Regular rhythm, borderline tachycardia Respiratory: Bilateral end expiratory wheeze with moderate inspiratory effort Abdomen: Soft, nontender, nondistended, positive bowel sounds Musculoskeletal: No clubbing or cyanosis or edema Skin: Skin breaks, tears or lesions Psychiatry: Patient slightly anxious, appropriate appropriate, no evidence of psychoses Neurology: Focal deficits  Data Reviewed: Noted normal COVID and flu titers, normal procalcitonin  Disposition:  Status is: Observation The patient remains OBS appropriate and will d/c before 2 midnights.    Anticipated discharge date: 10/13  Remaining issues to be resolved so that patient can be discharged: -Significant improvement in dyspnea and wheezing -Evaluation by PT and OT    Family Communication: We will call family DVT Prophylaxis: enoxaparin (LOVENOX) injection 40 mg Start: 03/26/22 0800    Author: Annita Brod ,MD 03/26/2022 3:21 PM  To reach On-call, see care teams to locate the attending and reach out via [email or web address]. Between 7PM-7AM, please contact night-coverage If you still have difficulty reaching the  attending provider, please page the [facility] (Director on Call) for [facility] on amion for assistance.

## 2022-03-26 NOTE — Assessment & Plan Note (Signed)
Patient normally on 3 L nasal cannula.  Initially hypoxic and following treatment with steroid and nebulizers, now back down to 3 L.  See below.

## 2022-03-26 NOTE — ED Triage Notes (Signed)
Pt to ED via EMS from home c/o respiratory distress tonight.  States started approx 2 hours PTA, wheezing throughout lungs, used 3 duoneb treatments at home, productive yellow cough, denies pain.  EMS arrived and gave 2 duoneb, 2 albuterol and 125mg  solumedrol en route.  Pt initially 80% on chronic 3L [place] at home and dropped to 71% with exertion.  Pt arrived on 4L [place] and 8L neb mask O2 sats 96%.  EMS vitals 165/83 BP, 120 HR, 136 CBG, 97.6 temp.  Dr. Tamala Julian in triage on arrival to assess patient.  Pt A&Ox4, denies pain, audible wheezing, speaking in short choppy sentences.

## 2022-03-26 NOTE — Assessment & Plan Note (Addendum)
Continue steroids and nebulizers.  Oxygen weaned down.  Still tight with significant wheezing.  Continue antitussives and flutter valve.  No evidence of infection, no need for antibiotics.  Acute on chronic respiratory failure with hypoxia.  Work on transitioning over steroids to p.o. taper.

## 2022-03-26 NOTE — Assessment & Plan Note (Signed)
-   Continue SSRI 

## 2022-03-26 NOTE — Assessment & Plan Note (Signed)
-  Nicotine patch 

## 2022-03-26 NOTE — Assessment & Plan Note (Signed)
Meets criteria BMI greater than 30 

## 2022-03-27 DIAGNOSIS — Z90710 Acquired absence of both cervix and uterus: Secondary | ICD-10-CM | POA: Diagnosis not present

## 2022-03-27 DIAGNOSIS — Z6830 Body mass index (BMI) 30.0-30.9, adult: Secondary | ICD-10-CM | POA: Diagnosis not present

## 2022-03-27 DIAGNOSIS — F419 Anxiety disorder, unspecified: Secondary | ICD-10-CM | POA: Diagnosis present

## 2022-03-27 DIAGNOSIS — F32A Depression, unspecified: Secondary | ICD-10-CM | POA: Diagnosis present

## 2022-03-27 DIAGNOSIS — R000 Tachycardia, unspecified: Secondary | ICD-10-CM | POA: Diagnosis present

## 2022-03-27 DIAGNOSIS — Z20822 Contact with and (suspected) exposure to covid-19: Secondary | ICD-10-CM | POA: Diagnosis present

## 2022-03-27 DIAGNOSIS — K219 Gastro-esophageal reflux disease without esophagitis: Secondary | ICD-10-CM | POA: Diagnosis present

## 2022-03-27 DIAGNOSIS — Z886 Allergy status to analgesic agent status: Secondary | ICD-10-CM | POA: Diagnosis not present

## 2022-03-27 DIAGNOSIS — J431 Panlobular emphysema: Secondary | ICD-10-CM | POA: Diagnosis present

## 2022-03-27 DIAGNOSIS — J9601 Acute respiratory failure with hypoxia: Secondary | ICD-10-CM | POA: Diagnosis not present

## 2022-03-27 DIAGNOSIS — Z9981 Dependence on supplemental oxygen: Secondary | ICD-10-CM | POA: Diagnosis not present

## 2022-03-27 DIAGNOSIS — I10 Essential (primary) hypertension: Secondary | ICD-10-CM | POA: Diagnosis present

## 2022-03-27 DIAGNOSIS — E669 Obesity, unspecified: Secondary | ICD-10-CM | POA: Diagnosis present

## 2022-03-27 DIAGNOSIS — D72829 Elevated white blood cell count, unspecified: Secondary | ICD-10-CM | POA: Diagnosis present

## 2022-03-27 DIAGNOSIS — G629 Polyneuropathy, unspecified: Secondary | ICD-10-CM | POA: Diagnosis present

## 2022-03-27 DIAGNOSIS — J9621 Acute and chronic respiratory failure with hypoxia: Secondary | ICD-10-CM | POA: Diagnosis present

## 2022-03-27 DIAGNOSIS — J441 Chronic obstructive pulmonary disease with (acute) exacerbation: Secondary | ICD-10-CM | POA: Diagnosis present

## 2022-03-27 DIAGNOSIS — Z79899 Other long term (current) drug therapy: Secondary | ICD-10-CM | POA: Diagnosis not present

## 2022-03-27 DIAGNOSIS — F17210 Nicotine dependence, cigarettes, uncomplicated: Secondary | ICD-10-CM | POA: Diagnosis present

## 2022-03-27 LAB — CBC
HCT: 44.3 % (ref 36.0–46.0)
Hemoglobin: 14.1 g/dL (ref 12.0–15.0)
MCH: 29.5 pg (ref 26.0–34.0)
MCHC: 31.8 g/dL (ref 30.0–36.0)
MCV: 92.7 fL (ref 80.0–100.0)
Platelets: 241 10*3/uL (ref 150–400)
RBC: 4.78 MIL/uL (ref 3.87–5.11)
RDW: 14.5 % (ref 11.5–15.5)
WBC: 19.9 10*3/uL — ABNORMAL HIGH (ref 4.0–10.5)
nRBC: 0 % (ref 0.0–0.2)

## 2022-03-27 LAB — HEMOGLOBIN A1C
Hgb A1c MFr Bld: 4.9 % (ref 4.8–5.6)
Mean Plasma Glucose: 93.93 mg/dL

## 2022-03-27 MED ORDER — METHYLPREDNISOLONE SODIUM SUCC 40 MG IJ SOLR
40.0000 mg | Freq: Two times a day (BID) | INTRAMUSCULAR | Status: AC
  Administered 2022-03-27 – 2022-03-30 (×6): 40 mg via INTRAVENOUS
  Filled 2022-03-27 (×6): qty 1

## 2022-03-27 MED ORDER — GUAIFENESIN ER 600 MG PO TB12
1200.0000 mg | ORAL_TABLET | Freq: Two times a day (BID) | ORAL | Status: DC
  Administered 2022-03-27 – 2022-03-31 (×9): 1200 mg via ORAL
  Filled 2022-03-27 (×9): qty 2

## 2022-03-27 MED ORDER — DOXYCYCLINE HYCLATE 100 MG PO TABS
100.0000 mg | ORAL_TABLET | Freq: Two times a day (BID) | ORAL | Status: DC
  Administered 2022-03-27 – 2022-03-31 (×9): 100 mg via ORAL
  Filled 2022-03-27 (×9): qty 1

## 2022-03-27 NOTE — Evaluation (Signed)
Physical Therapy Evaluation Patient Details Name: Michelle Shaffer MRN: [id number] DOB: [birth year] Today's Date: 03/27/2022  History of Present Illness  Pt is an 81 yo female that presented to ED for respiratory distress. PMH of COPD on 3L, HTN, smoking, anxiety, depression.   Clinical Impression  Patient alert, agreeable to PT denied pain. Pt stated at baseline she needs assist from her daughter for mobility, ADLs, falls at least once a month per pt.   Patient able to perform supine to sit with minA. Good sitting balance, minA to assist with donning briefs in sitting and in standing. Sit <> stand with minA and RW, once up in standing CGA to ambulate ~49ft. She did not display a LOB, but did seem to fatigued. Returned to supine with needs in reach.  Overall the patient demonstrated deficits (see "PT Problem List") that impede the patient's functional abilities, safety, and mobility and would benefit from skilled PT intervention. Recommendation at this time is HHPT with constant supervision/assistance.         Recommendations for follow up therapy are one component of a multi-disciplinary discharge planning process, led by the attending physician.  Recommendations may be updated based on patient status, additional functional criteria and insurance authorization.  Follow Up Recommendations Home health PT      Assistance Recommended at Discharge Frequent or constant Supervision/Assistance  Patient can return home with the following  A little help with walking and/or transfers;A little help with bathing/dressing/bathroom;Assistance with cooking/housework;Assistance with feeding;Assist for transportation;Help with stairs or ramp for entrance;Direct supervision/assist for medications management    Equipment Recommendations Wheelchair cushion (measurements PT);Wheelchair (measurements PT)  Recommendations for Other Services       Functional Status Assessment Patient has had a recent decline  in their functional status and demonstrates the ability to make significant improvements in function in a reasonable and predictable amount of time.     Precautions / Restrictions Precautions Precautions: Fall Restrictions Weight Bearing Restrictions: No      Mobility  Bed Mobility Overal bed mobility: Needs Assistance Bed Mobility: Supine to Sit, Sit to Supine     Supine to sit: Min assist, HOB elevated Sit to supine: Min guard, HOB elevated        Transfers Overall transfer level: Needs assistance Equipment used: Rolling walker (2 wheels) Transfers: Sit to/from Stand Sit to Stand: Min assist                Ambulation/Gait Ambulation/Gait assistance: Min guard Gait Distance (Feet): 15 Feet Assistive device: Rolling walker (2 wheels)         General Gait Details: no LOB, but noted for fatigue  Stairs            Wheelchair Mobility    Modified Rankin (Stroke Patients Only)       Balance Overall balance assessment: Needs assistance Sitting-balance support: Feet supported Sitting balance-Leahy Scale: Good       Standing balance-Leahy Scale: Fair                               Pertinent Vitals/Pain Pain Assessment Pain Assessment: No/denies pain    Home Living Family/patient expects to be discharged to:: Private residence Living Arrangements: Alone Available Help at Discharge: Family Type of Home: House Home Access: Stairs to enter;Ramped entrance Entrance Stairs-Rails: Right;Left Entrance Stairs-Number of Steps: 3   Home Layout: One level Home Equipment: Conservation officer, nature (2 wheels);Cane - single point  Prior Function Prior Level of Function : Needs assist       Physical Assist : Mobility (physical);ADLs (physical) Mobility (physical): Bed mobility;Transfers;Gait ADLs (physical): Grooming;Bathing;Dressing;Toileting;IADLs         Hand Dominance        Extremity/Trunk Assessment   Upper Extremity  Assessment Upper Extremity Assessment: Generalized weakness    Lower Extremity Assessment Lower Extremity Assessment: Generalized weakness    Cervical / Trunk Assessment Cervical / Trunk Assessment: Normal  Communication   Communication: No difficulties  Cognition Arousal/Alertness: Awake/alert Behavior During Therapy: WFL for tasks assessed/performed Overall Cognitive Status: Within Functional Limits for tasks assessed                                          General Comments      Exercises     Assessment/Plan    PT Assessment Patient needs continued PT services  PT Problem List Decreased strength;Decreased range of motion;Decreased balance;Decreased activity tolerance;Decreased knowledge of use of DME;Pain;Decreased mobility       PT Treatment Interventions DME instruction;Therapeutic exercise;Balance training;Gait training;Stair training;Neuromuscular re-education;Functional mobility training;Therapeutic activities;Patient/family education    PT Goals (Current goals can be found in the Care Plan section)  Acute Rehab PT Goals Patient Stated Goal: to go home PT Goal Formulation: With patient Time For Goal Achievement: 04/10/22 Potential to Achieve Goals: Good    Frequency Min 2X/week     Co-evaluation               AM-PAC PT "6 Clicks" Mobility  Outcome Measure Help needed turning from your back to your side while in a flat bed without using bedrails?: A Little Help needed moving from lying on your back to sitting on the side of a flat bed without using bedrails?: A Little Help needed moving to and from a bed to a chair (including a wheelchair)?: A Little Help needed standing up from a chair using your arms (e.g., wheelchair or bedside chair)?: A Little Help needed to walk in hospital room?: A Little Help needed climbing 3-5 steps with a railing? : A Lot 6 Click Score: 17    End of Session Equipment Utilized During Treatment: Gait  belt Activity Tolerance: Patient tolerated treatment well Patient left: in bed;with call bell/phone within reach;with bed alarm set Nurse Communication: Mobility status PT Visit Diagnosis: Other abnormalities of gait and mobility (R26.89);Difficulty in walking, not elsewhere classified (R26.2);Muscle weakness (generalized) (M62.81)    Time: 7793-9030 PT Time Calculation (min) (ACUTE ONLY): 26 min   Charges:   PT Evaluation $PT Eval Low Complexity: 1 Low PT Treatments $Therapeutic Activity: 8-22 mins        Lieutenant Diego PT, DPT 4:02 PM,03/27/22

## 2022-03-27 NOTE — Care Management Obs Status (Signed)
[place] NOTIFICATION   Patient Details  Name: Michelle Shaffer MRN: [id number] Date of Birth: 07/14/1940   Medicare Observation Status Notification Given:  Yes Put on front of chart.    Candie Chroman, LCSW 03/27/2022, 12:37 PM

## 2022-03-27 NOTE — Plan of Care (Signed)

## 2022-03-27 NOTE — Progress Notes (Signed)
PROGRESS NOTE  Michelle Shaffer  DOB: December 10, 1940  PCP: [facility], [place] [id number]  DOA: 03/26/2022  LOS: 0 days  Hospital Day: 2  Brief narrative: Michelle Shaffer is a 81 y.o. female with PMH significant for chronic smoking, COPD on 3 L oxygen by nasal cannula, obesity, HTN 10/12, patient was brought to the ED by EMS with acute respiratory distress that started few hours earlier.  O2 sat was noted to be 71% on 3 L baseline.  She was put on nonrebreather oxygen brought to the ED. In the ED, patient was noted to have significant dyspnea, wheezing, productive cough COVID and flu test negative Chest x-ray showed emphysema without focal consolidation Started on management for COPD exacerbation with Solu-Medrol, nebulizers Admitted to hospitalist service  Subjective: Patient was seen and examined this afternoon.  Pleasant elderly Caucasian female.  Propped up in bed.  She is dyspneic, grunting and wheezing on 2 L oxygen.  Does not feel better.  Does not feel ready to go home. Chart reviewed In the last 24 hours, no fever, heart rate in low 100s, blood pressure trending down to normal this morning, on 2 L oxygen nasal cannula this morning  Assessment and plan: Acute exacerbation of COPD Presented with acute respiratory distress, wheezing, productive cough in the setting of COPD and chronic smoking.  Chest x-ray without infiltrates.  Patient probably had secretions causing airway obstruction.  Currently on bronchodilators, nebulizers, antitussives.  IV Solu-Medrol switched to oral prednisone this morning.   At this time, with her dyspnea, cough, wheezing, grunting , I would switch her back to IV steroids.  I would add doxycycline, scheduled Mucinex, incentive spirometry, flutter valve.  Encourage ambulation.   Acute on chronic respiratory failure with hypoxia On 3 L oxygen by nasal at baseline.  Initially required nonrebreather mask in the ED.  Gradually weaned down as  tolerated.  Without respite distress at the time of my evaluation this afternoon, I increased her oxygen to 4 L.  Essential hypertension PTA on amlodipine 5 mg daily, lisinopril 20 mg daily.  Continue same Continue blood pressure monitoring   Anxiety/depression PTA Klonopin 0.5 mg nightly as needed, trazodone 25 mg nightly, Continue both  Neuropathy Continue gabapentin 300 mg 3 times daily  GERD PPI   Chronic smoker Counseled to quit  nicotine patch offered   Obesity (BMI 30-39.9) Meets criteria BMI greater than 30 Counsel weight loss   Goals of care   Code Status: Full Code    Mobility: Encourage ambulation.  PT eval ordered  Skin assessment:     Nutritional status:  Body mass index is 30.36 kg/m.          Diet:  Diet Order             Diet Heart Room service appropriate? Yes; Fluid consistency: Thin  Diet effective now                   DVT prophylaxis:  enoxaparin (LOVENOX) injection 40 mg Start: 03/26/22 0800   Antimicrobials: Doxycycline oral Fluid: None Consultants: None Family Communication: None at bedside  Status is: Observation  Continue in-hospital care because: Respite status not improving.  Needs to go back to IV steroids Level of care: Med-Surg   Dispo: The patient is from: Home              Anticipated d/c is to: Pending clinical course  Patient currently is not medically stable to d/c.   Difficult to place patient No     Infusions:    Scheduled Meds:  amLODipine  5 mg Oral Daily   doxycycline  100 mg Oral Q12H   enoxaparin (LOVENOX) injection  40 mg Subcutaneous Q24H   gabapentin  300 mg Oral TID   guaiFENesin  1,200 mg Oral BID   ipratropium-albuterol  3 mL Nebulization Q6H   lisinopril  20 mg Oral Daily   methylPREDNISolone (SOLU-MEDROL) injection  40 mg Intravenous Q12H   nicotine  14 mg Transdermal Daily   pantoprazole  40 mg Oral Daily   traZODone  25 mg Oral QHS    PRN meds: acetaminophen  **OR** acetaminophen, albuterol, clonazePAM, ondansetron **OR** ondansetron (ZOFRAN) IV   Antimicrobials: Anti-infectives (From admission, onward)    Start     Dose/Rate Route Frequency Ordered Stop   03/27/22 1530  doxycycline (VIBRA-TABS) tablet 100 mg        100 mg Oral Every 12 hours 03/27/22 1441         Objective: Vitals:   03/27/22 0544 03/27/22 0805  BP: 132/75 119/72  Pulse: 83 79  Resp: 20 18  Temp: 98.4 F (36.9 C) 97.7 F (36.5 C)  SpO2: 92% 94%    Intake/Output Summary (Last 24 hours) at 03/27/2022 1445 Last data filed at 03/27/2022 0900 Gross per 24 hour  Intake 240 ml  Output --  Net 240 ml   Filed Weights   03/26/22 0123  Weight: 75.3 kg   Weight change:  Body mass index is 30.36 kg/m.   Physical Exam: General exam: Pleasant, elderly Caucasian female.  In respiratory distress Skin: No rashes, lesions or ulcers. HEENT: Atraumatic, normocephalic, no obvious bleeding Lungs: Tachypneic, diffuse scattered wheezing, grunting, coughs on deep breathing CVS: Regular rate and rhythm, no murmur GI/Abd soft, nontender, nondistended, bowel sound present CNS: Alert, awake, oriented x3 Psychiatry: Sad affect Extremities: No pedal edema, no calf tenderness  Data Review: I have personally reviewed the laboratory data and studies available.  F/u labs ordered Unresulted Labs (From admission, onward)     Start     Ordered   04/02/22 0500  Creatinine, serum  (enoxaparin (LOVENOX)    CrCl >/= 30 ml/min)  Weekly,   R     Comments: while on enoxaparin therapy    03/26/22 0341            Signed, Terrilee Croak, MD [facility] 03/27/2022

## 2022-03-28 MED ORDER — HYDROCOD POLI-CHLORPHE POLI ER 10-8 MG/5ML PO SUER
5.0000 mL | Freq: Every day | ORAL | Status: DC
  Administered 2022-03-28 – 2022-03-30 (×3): 5 mL via ORAL
  Filled 2022-03-28 (×3): qty 5

## 2022-03-28 NOTE — Progress Notes (Signed)
PROGRESS NOTE  Michelle Shaffer  DOB: 1941-03-19  PCP: [facility], [facility] [employer] [id number]  DOA: 03/26/2022  LOS: 1 day  Hospital Day: 3  Brief narrative: Michelle Shaffer is a 81 y.o. female with PMH significant for chronic smoking, COPD on 3 L oxygen by nasal cannula, obesity, HTN 10/12, patient was brought to the ED by EMS with acute respiratory distress that started few hours earlier.  O2 sat was noted to be 71% on 3 L baseline.  She was put on nonrebreather oxygen brought to the ED. In the ED, patient was noted to have significant dyspnea, wheezing, productive cough COVID and flu test negative Chest x-ray showed emphysema without focal consolidation Started on management for COPD exacerbation with Solu-Medrol, nebulizers Admitted to hospitalist service  Subjective: Patient was seen and examined this morning. Patient states he does not feel good.  She was coughing all night long and could not fall asleep.   On 4 L oxygen by nasal cannula this morning.  Assessment and plan: Acute exacerbation of COPD Presented with acute respiratory distress, wheezing, productive cough in the setting of COPD and chronic smoking.  Chest x-ray without infiltrates.  Patient probably had secretions causing airway obstruction.  Currently on IV Solu-Medrol bronchodilators, nebulizers, Mucinex.   On lung auscultation, I hear less wheezing today.  Patient however states that she has been coughing all night long and could not fall asleep. Add Tussionex today per patient's request.   Acute on chronic respiratory failure with hypoxia On 3 L oxygen by nasal at baseline.  Initially required nonrebreather mask in the ED.  Gradually weaned down as tolerated.  Currently on 4 L oxygen.  Wean down as tolerated.  Check ambulatory oxygen requirement.  Essential hypertension PTA on amlodipine 5 mg daily, lisinopril 20 mg daily.  Continue same Continue blood pressure monitoring   Anxiety/depression PTA  Klonopin 0.5 mg nightly as needed, trazodone 25 mg nightly, Continue both  Neuropathy Continue gabapentin 300 mg 3 times daily  GERD PPI   Chronic smoker Counseled to quit  nicotine patch offered   Obesity (BMI 30-39.9) Meets criteria BMI greater than 30 Counsel weight loss   Goals of care   Code Status: Full Code    Mobility: Encourage ambulation.  PT eval obtained.  Home health PT recommended.  Skin assessment:     Nutritional status:  Body mass index is 30.36 kg/m.          Diet:  Diet Order             Diet Heart Room service appropriate? Yes; Fluid consistency: Thin  Diet effective now                   DVT prophylaxis:  enoxaparin (LOVENOX) injection 40 mg Start: 03/26/22 0800   Antimicrobials: Doxycycline oral Fluid: None Consultants: None Family Communication: None at bedside  Status is: Observation  Continue in-hospital care because: Respiratory status gradually improving. Level of care: Med-Surg   Dispo: The patient is from: Home              Anticipated d/c is to: Pending clinical course              Patient currently is not medically stable to d/c.   Difficult to place patient No     Infusions:    Scheduled Meds:  amLODipine  5 mg Oral Daily   chlorpheniramine-HYDROcodone  5 mL Oral QHS   doxycycline  100 mg Oral Q12H  enoxaparin (LOVENOX) injection  40 mg Subcutaneous Q24H   gabapentin  300 mg Oral TID   guaiFENesin  1,200 mg Oral BID   ipratropium-albuterol  3 mL Nebulization Q6H   lisinopril  20 mg Oral Daily   methylPREDNISolone (SOLU-MEDROL) injection  40 mg Intravenous Q12H   nicotine  14 mg Transdermal Daily   pantoprazole  40 mg Oral Daily   traZODone  25 mg Oral QHS    PRN meds: acetaminophen **OR** acetaminophen, albuterol, clonazePAM, ondansetron **OR** ondansetron (ZOFRAN) IV   Antimicrobials: Anti-infectives (From admission, onward)    Start     Dose/Rate Route Frequency Ordered Stop   03/27/22  1530  doxycycline (VIBRA-TABS) tablet 100 mg        100 mg Oral Every 12 hours 03/27/22 1441         Objective: Vitals:   03/28/22 0442 03/28/22 0753  BP: (!) 145/86 (!) 143/62  Pulse: 82 92  Resp: 20 20  Temp: 97.7 F (36.5 C) (!) 97.4 F (36.3 C)  SpO2: 97%     Intake/Output Summary (Last 24 hours) at 03/28/2022 1236 Last data filed at 03/28/2022 0931 Gross per 24 hour  Intake --  Output 1700 ml  Net -1700 ml   Filed Weights   03/26/22 0123  Weight: 75.3 kg   Weight change:  Body mass index is 30.36 kg/m.   Physical Exam: General exam: Pleasant, elderly Caucasian female. Improving respiratory distress Skin: No rashes, lesions or ulcers. HEENT: Atraumatic, normocephalic, no obvious bleeding Lungs: No wheezing or crackles.  Coughs on deep breathing.   CVS: Regular rate and rhythm, no murmur GI/Abd soft, nontender, nondistended, bowel sound present CNS: Alert, awake, oriented x3 Psychiatry: Mood appropriate Extremities: No pedal edema, no calf tenderness  Data Review: I have personally reviewed the laboratory data and studies available.  F/u labs ordered Unresulted Labs (From admission, onward)     Start     Ordered   04/02/22 0500  Creatinine, serum  (enoxaparin (LOVENOX)    CrCl >/= 30 ml/min)  Weekly,   R     Comments: while on enoxaparin therapy    03/26/22 0341            Signed, Terrilee Croak, MD [facility] 03/28/2022

## 2022-03-28 NOTE — Evaluation (Signed)
Occupational Therapy Evaluation Patient Details Name: Michelle Shaffer MRN: [id number] DOB: 1941-05-25 Today's Date: 03/28/2022   History of Present Illness Pt is an 81 yo female that presented to ED for respiratory distress. PMH of COPD on 3L, HTN, smoking, anxiety, depression.   Clinical Impression   Pt seen for OT evaluation this date.  Pt pleasant, cooperative, eager to get up and move around.  Pt reports the last time she was hospitalized she went to SNF, and pt is very eager to return home with family this time.  Pt reports that her daughter lives with her and assists with all self care at baseline.  Pt presents with fair balance; able to use RW in room with close supv, no LOB.  Stood at sink to [employer].  Monitored 02 sats throughout activity; 02 sats 100% on 4L.  Pt stated that she had been ambulating to the bathroom without 02.  With activity noted above, pt was able to stay between 96 and 98% on room air, but OT donned 02 again to set pt up in chair to wait for breakfast tray.  OT instructed pt in use of incentive spirometer and flutter valve; see note for details.  Pt reports that she feels more weak than normal.  Pt presents with decreased activity tolerance affecting ADLs and mobility.  Pt would benefit from additional skilled OT in the acute setting to reinforce EC strategies, ADL safety, and fall prevention.  Would benefit from [facility] OT upon d/c to reinforce these strategies in the home and work towards return to [place].        Recommendations for follow up therapy are one component of a multi-disciplinary discharge planning process, led by the attending physician.  Recommendations may be updated based on patient status, additional functional criteria and insurance authorization.   Follow Up Recommendations  Home health OT    Assistance Recommended at Discharge Frequent or constant Supervision/Assistance  Patient can return home with the following A little help with walking and/or  transfers;A little help with bathing/dressing/bathroom;Assistance with cooking/housework    Functional Status Assessment  Patient has had a recent decline in their functional status and demonstrates the ability to make significant improvements in function in a reasonable and predictable amount of time.  Equipment Recommendations  None recommended by OT    Recommendations for Other Services       Precautions / Restrictions Precautions Precautions: Fall Restrictions Weight Bearing Restrictions: No      Mobility Bed Mobility               General bed mobility comments: NT today; pt was sitting up on EOB upon OT arrival and left sitting in chair at end of session Patient Response: Cooperative  Transfers Overall transfer level: Needs assistance Equipment used: Rolling walker (2 wheels) Transfers: Sit to/from Stand Sit to Stand: Supervision                  Balance Overall balance assessment: Needs assistance Sitting-balance support: Feet supported Sitting balance-Leahy Scale: Good       Standing balance-Leahy Scale: Fair Standing balance comment: Able to stand at sink to comb hair with 1 hand on support surface                           ADL either performed or assessed with clinical judgement   ADL Overall ADL's : Needs assistance/impaired     Grooming: Wash/dry hands;Brushing hair;Supervision/safety;Standing  Functional mobility during ADLs: Supervision/safety;Rolling walker (2 wheels) General ADL Comments: Pt ambulatory in room with RW and close supv; pt stated nursing had already helped her to get to the bathroom to toilet, but pt requested to walk to door and comb hair at sink.  Pt agreeable to sit up in chair to wait for breakfast tray.     Vision Patient Visual Report: No change from baseline                  Pertinent Vitals/Pain Pain Assessment Pain Assessment: No/denies pain      Hand Dominance     Extremity/Trunk Assessment Upper Extremity Assessment Upper Extremity Assessment: Generalized weakness   Lower Extremity Assessment Lower Extremity Assessment: Generalized weakness   Cervical / Trunk Assessment Cervical / Trunk Assessment: Normal   Communication Communication Communication: No difficulties   Cognition Arousal/Alertness: Awake/alert Behavior During Therapy: WFL for tasks assessed/performed Overall Cognitive Status: Within Functional Limits for tasks assessed                                       General Comments       Exercises Other Exercises Other Exercises: Instructed pt in use of incentive spirometer and flutter valve; 10 reps each with mod vc for technique.  Encouraged pt use both hourly when awake.   Shoulder Instructions      Home Living Family/patient expects to be discharged to:: Private residence Living Arrangements: Children Available Help at Discharge: Family Type of Home: House Home Access: Stairs to enter;Ramped entrance Entrance Stairs-Number of Steps: 3 Entrance Stairs-Rails: Right;Left Home Layout: One level     Bathroom Shower/Tub: Chief Strategy Officer: Standard     Home Equipment: Agricultural consultant (2 wheels);Cane - single point;Shower seat          Prior Functioning/Environment Prior Level of Function : Needs assist       Physical Assist : Mobility (physical);ADLs (physical) Mobility (physical): Bed mobility;Transfers;Gait ADLs (physical): Grooming;Bathing;Dressing;Toileting;IADLs            OT Problem List: Decreased activity tolerance;Cardiopulmonary status limiting activity;Impaired balance (sitting and/or standing)      OT Treatment/Interventions: Self-care/ADL training;Therapeutic exercise;Patient/family education;Balance training;Therapeutic activities;DME and/or AE instruction    OT Goals(Current goals can be found in the care plan section) Acute Rehab OT  Goals Patient Stated Goal: To go home with family OT Goal Formulation: With patient Time For Goal Achievement: 04/11/22 Potential to Achieve Goals: Good  OT Frequency: Min 2X/week                  AM-PAC OT "6 Clicks" Daily Activity     Outcome Measure Help from another person eating meals?: None Help from another person taking care of personal grooming?: A Little Help from another person toileting, which includes using toliet, bedpan, or urinal?: A Little Help from another person bathing (including washing, rinsing, drying)?: A Little Help from another person to put on and taking off regular upper body clothing?: None Help from another person to put on and taking off regular lower body clothing?: A Little 6 Click Score: 20   End of Session Equipment Utilized During Treatment: Gait belt;Rolling walker (2 wheels)  Activity Tolerance: Patient tolerated treatment well Patient left: in chair;with call bell/phone within reach  OT Visit Diagnosis: Muscle weakness (generalized) (M62.81);Unsteadiness on feet (R26.81)  Time: 6384-6659 OT Time Calculation (min): 27 min Charges:  OT General Charges $OT Visit: 1 Visit OT Evaluation $OT Eval Moderate Complexity: 1 Mod  Danelle Earthly, MS, OTR/L   [facility] Dials 03/28/2022, 11:49 AM

## 2022-03-28 NOTE — TOC Initial Note (Signed)
Transition of Care [facility]) - Initial/Assessment Note    Patient Details  Name: Michelle Shaffer MRN: [id number] Date of Birth: 07/01/1940  Transition of Care [facility] CM/SW Contact:    Harriet Masson, RN Phone [phone] 03/28/2022, 4:58 PM  Clinical Narrative:                 Spoke with pt and daughter Neoma Laming who was at bedside today concerning recommendations for HHPT. Pt receptive to these recommendations for [facility] and continue to request a wheelchair.  Contacted the follow ing [place] agency for services: [facility] (declined) [facility] (Meg- LVM) [facility]) who has requested a call back Sunday on the possible discharge date before accepting this pt.  This reach out with this information and notify the pt's daughter Neoma Laming [phone] once final.  Expected Discharge Plan: [place] Barriers to Discharge: Continued Medical Work up   Patient Goals and CMS Choice Patient states their goals for this hospitalization and ongoing recovery are:: Go home CMS Medicare.gov Compare Post Acute Care list provided to:: Patient Choice offered to / list presented to : Patient  Expected Discharge Plan and Services Expected Discharge Plan: [place]   Discharge Planning Services: CM Consult   Living arrangements for the past 2 months: Single Family Home                           HH Arranged: PT          Prior Living Arrangements/Services Living arrangements for the past 2 months: Single Family Home Lives with:: Adult Children Patient language and need for interpreter reviewed:: Yes Do you feel safe going back to the place where you live?: Yes      Need for Family Participation in Patient Care: Yes (Comment) Care giver support system in place?: Yes (comment)   Criminal Activity/Legal Involvement Pertinent to Current Situation/Hospitalization: No - Comment as needed  Activities of Daily Living      Permission  Sought/Granted Permission sought to share information with : Case Manager Permission granted to share information with : Yes, Verbal Permission Granted        Permission granted to share info w Relationship: Daughter  Permission granted to share info w Contact Information: Neoma Laming  Emotional Assessment Appearance:: Appears stated age Attitude/Demeanor/Rapport: Engaged Affect (typically observed): Accepting, Anxious Orientation: : Oriented to Self, Oriented to Place, Oriented to  Time   Psych Involvement: No (comment)  Admission diagnosis:  COPD exacerbation ([place]) [J44.1] Acute respiratory failure with hypoxia ([place]) [J96.01] COPD with acute exacerbation ([place]) [J44.1] Patient Active Problem List   Diagnosis Date Noted   COPD with acute exacerbation ([place]) 03/26/2022   Tobacco use disorder 03/26/2022   Obesity (BMI 30-39.9) 03/26/2022   Essential (primary) hypertension 03/26/2022   Depression 03/26/2022   Anxiety 03/26/2022   PCP:  Medicine, [place]:   [place] [address] [place]) [place] [address] Phone: [phone] Fax: [phone]     Social Determinants of Health (SDOH) Interventions    Readmission Risk Interventions     No data to display

## 2022-03-29 MED ORDER — ALUM & MAG HYDROXIDE-SIMETH 200-200-20 MG/5ML PO SUSP
30.0000 mL | ORAL | Status: DC | PRN
  Administered 2022-03-29: 30 mL via ORAL
  Filled 2022-03-29: qty 30

## 2022-03-29 MED ORDER — FUROSEMIDE 10 MG/ML IJ SOLN
20.0000 mg | Freq: Once | INTRAMUSCULAR | Status: AC
  Administered 2022-03-29: 20 mg via INTRAVENOUS
  Filled 2022-03-29: qty 4

## 2022-03-29 NOTE — Progress Notes (Signed)
PROGRESS NOTE  Michelle Shaffer  DOB: 1940-10-08  PCP: [facility] ERD:[id number]  DOA: 03/26/2022  LOS: 2 days  Hospital Day: 4  Brief narrative: Michelle Shaffer is a 81 y.o. female with PMH significant for chronic smoking, COPD on 3 L oxygen by nasal cannula, obesity, HTN 10/12, patient was brought to the ED by EMS with acute respiratory distress that started few hours earlier.  O2 sat was noted to be 71% on 3 L baseline.  She was put on nonrebreather oxygen brought to the ED. In the ED, patient was noted to have significant dyspnea, wheezing, productive cough COVID and flu test negative Chest x-ray showed emphysema without focal consolidation Started on management for COPD exacerbation with Solu-Medrol, nebulizers Admitted to hospitalist service  Subjective: Patient was seen and examined this morning. Does not feel good.  Wheezing audible. Tussionex last night for cough and slept okay last night On 4 L oxygen nasal cannula.  Assessment and plan: Acute exacerbation of COPD Presented with acute respiratory distress, wheezing, productive cough in the setting of COPD and chronic smoking.  Chest x-ray without infiltrates.  Patient probably had secretions causing airway obstruction.  Currently on IV Solu-Medrol bronchodilators, nebulizers, Mucinex.   Wheezing is more obvious and audible today.  I will order 1 dose of IV Lasix today.  Obtain echocardiogram to rule out CHF. Mucinex and Tussionex for cough.   Acute on chronic respiratory failure with hypoxia On 3 L oxygen by nasal at baseline.  Initially required nonrebreather mask in the ED.  Gradually weaned down as tolerated.  Currently on 4 L oxygen.  Wean down as tolerated.  Check ambulatory oxygen requirement.  Essential hypertension PTA on amlodipine 5 mg daily, lisinopril 20 mg daily.  Continue to monitor on the same.   Anxiety/depression PTA Klonopin 0.5 mg nightly as needed, trazodone 25 mg  nightly, Continue both  Neuropathy Continue gabapentin 300 mg 3 times daily  GERD PPI   Chronic smoker Counseled to quit  nicotine patch offered   Obesity (BMI 30-39.9) Meets criteria BMI greater than 30 Counsel weight loss   Goals of care   Code Status: Full Code    Mobility: Encourage ambulation.  PT eval obtained.  Home health PT recommended.  Skin assessment:     Nutritional status:  Body mass index is 30.36 kg/m.          Diet:  Diet Order             Diet Heart Room service appropriate? Yes; Fluid consistency: Thin  Diet effective now                   DVT prophylaxis:  enoxaparin (LOVENOX) injection 40 mg Start: 03/26/22 0800   Antimicrobials: Doxycycline oral Fluid: None Consultants: None Family Communication: None at bedside  Status is: Observation  Continue in-hospital care because: Respiratory status still not back to baseline. Level of care: Med-Surg   Dispo: The patient is from: Home              Anticipated d/c is to: Pending clinical course.  Hopefully home tomorrow              Patient currently is not medically stable to d/c.   Difficult to place patient No     Infusions:    Scheduled Meds:  amLODipine  5 mg Oral Daily   chlorpheniramine-HYDROcodone  5 mL Oral QHS   doxycycline  100 mg Oral Q12H  enoxaparin (LOVENOX) injection  40 mg Subcutaneous Q24H   furosemide  20 mg Intravenous Once   gabapentin  300 mg Oral TID   guaiFENesin  1,200 mg Oral BID   ipratropium-albuterol  3 mL Nebulization Q6H   lisinopril  20 mg Oral Daily   methylPREDNISolone (SOLU-MEDROL) injection  40 mg Intravenous Q12H   nicotine  14 mg Transdermal Daily   pantoprazole  40 mg Oral Daily   traZODone  25 mg Oral QHS    PRN meds: acetaminophen **OR** acetaminophen, albuterol, clonazePAM, ondansetron **OR** ondansetron (ZOFRAN) IV   Antimicrobials: Anti-infectives (From admission, onward)    Start     Dose/Rate Route Frequency Ordered  Stop   03/27/22 1530  doxycycline (VIBRA-TABS) tablet 100 mg        100 mg Oral Every 12 hours 03/27/22 1441         Objective: Vitals:   03/29/22 0330 03/29/22 0808  BP: 126/70 132/85  Pulse: 76 94  Resp: 18 18  Temp: 97.7 F (36.5 C) 98.7 F (37.1 C)  SpO2: 97% 92%   No intake or output data in the 24 hours ending 03/29/22 1141  Filed Weights   03/26/22 0123  Weight: 75.3 kg   Weight change:  Body mass index is 30.36 kg/m.   Physical Exam: General exam: Pleasant, elderly Caucasian female. Improving respiratory distress Skin: No rashes, lesions or ulcers. HEENT: Atraumatic, normocephalic, no obvious bleeding Lungs: Audible wheezing.  No crackles.  Coughs on deep breathing.   CVS: Regular rate and rhythm, no murmur GI/Abd soft, nontender, nondistended, bowel sound present CNS: Alert, awake, oriented x3 Psychiatry: Mood appropriate Extremities: No pedal edema, no calf tenderness  Data Review: I have personally reviewed the laboratory data and studies available.  F/u labs ordered Unresulted Labs (From admission, onward)     Start     Ordered   04/02/22 0500  Creatinine, serum  (enoxaparin (LOVENOX)    CrCl >/= 30 ml/min)  Weekly,   R     Comments: while on enoxaparin therapy    03/26/22 0341            Signed, Terrilee Croak, MD [facility] 03/29/2022

## 2022-03-29 NOTE — TOC Progression Note (Signed)
Transition of Care [facility]) - Progression Note    Patient Details  Name: Michelle Shaffer MRN: [id number] Date of Birth: 1940/11/01  Transition of Care [facility]) CM/SW Contact  Beverly Sessions, RN Phone Number: 03/29/2022, 4:10 PM  Clinical Narrative:    Patient states she does not know who her home O2 is through.   She state her son or daughter will be transporting at discharge and can bring a portable tank   Expected Discharge Plan: [place] Barriers to Discharge: Continued Medical Work up  Expected Discharge Plan and Services Expected Discharge Plan: [place]   Discharge Planning Services: CM Consult   Living arrangements for the past 2 months: Single Family Home                           HH Arranged: PT           Social Determinants of Health (SDOH) Interventions    Readmission Risk Interventions     No data to display

## 2022-03-29 NOTE — TOC Progression Note (Signed)
Transition of Care [facility]) - Progression Note    Patient Details  Name: Michelle Shaffer MRN: [id number] Date of Birth: 06-Dec-1940  Transition of Care [facility]) CM/SW Contact  Beverly Sessions, RN Phone Number: 03/29/2022, 1:48 PM  Clinical Narrative:     Corene Cornea with [facility] confirms they can accept patient DME order placed for WC.  Will obtain closer to discharge VM left for daughter to determine who patients home o2 is through, and make sure she will be able to bring portable at discharge    Expected Discharge Plan: [place] Barriers to Discharge: Continued Medical Work up  Expected Discharge Plan and Services Expected Discharge Plan: [place]   Discharge Planning Services: CM Consult   Living arrangements for the past 2 months: Single Family Home                           HH Arranged: PT           Social Determinants of Health (SDOH) Interventions    Readmission Risk Interventions     No data to display

## 2022-03-30 ENCOUNTER — Inpatient Hospital Stay: Admit: 2022-03-30 | Payer: Medicare Other

## 2022-03-30 LAB — BASIC METABOLIC PANEL
Anion gap: 6 (ref 5–15)
BUN: 27 mg/dL — ABNORMAL HIGH (ref 8–23)
CO2: 29 mmol/L (ref 22–32)
Calcium: 9 mg/dL (ref 8.9–10.3)
Chloride: 103 mmol/L (ref 98–111)
Creatinine, Ser: 0.9 mg/dL (ref 0.44–1.00)
GFR, Estimated: >60 mL/min (ref >60)
Glucose, Bld: 103 mg/dL — ABNORMAL HIGH (ref 70–99)
Potassium: 4.3 mmol/L (ref 3.5–5.1)
Sodium: 138 mmol/L (ref 135–145)

## 2022-03-30 LAB — CBC WITH DIFFERENTIAL/PLATELET
Abs Immature Granulocytes: 0.12 10*3/uL — ABNORMAL HIGH (ref 0.00–0.07)
Basophils Absolute: 0 10*3/uL (ref 0.0–0.1)
Basophils Relative: 0 %
Eosinophils Absolute: 0 10*3/uL (ref 0.0–0.5)
Eosinophils Relative: 0 %
HCT: 43.8 % (ref 36.0–46.0)
Hemoglobin: 13.9 g/dL (ref 12.0–15.0)
Immature Granulocytes: 1 %
Lymphocytes Relative: 13 %
Lymphs Abs: 1.7 10*3/uL (ref 0.7–4.0)
MCH: 29.8 pg (ref 26.0–34.0)
MCHC: 31.7 g/dL (ref 30.0–36.0)
MCV: 93.8 fL (ref 80.0–100.0)
Monocytes Absolute: 0.7 10*3/uL (ref 0.1–1.0)
Monocytes Relative: 6 %
Neutro Abs: 10.5 10*3/uL — ABNORMAL HIGH (ref 1.7–7.7)
Neutrophils Relative %: 80 %
Platelets: 264 10*3/uL (ref 150–400)
RBC: 4.67 MIL/uL (ref 3.87–5.11)
RDW: 14.8 % (ref 11.5–15.5)
WBC: 13.1 10*3/uL — ABNORMAL HIGH (ref 4.0–10.5)
nRBC: 0 % (ref 0.0–0.2)

## 2022-03-30 LAB — ECHOCARDIOGRAM COMPLETE: Height: 62 in

## 2022-03-30 MED ORDER — METHYLPREDNISOLONE SODIUM SUCC 40 MG IJ SOLR
40.0000 mg | INTRAMUSCULAR | Status: DC
  Administered 2022-03-30: 40 mg via INTRAVENOUS
  Filled 2022-03-30: qty 1

## 2022-03-30 MED ORDER — METHYLPREDNISOLONE SODIUM SUCC 40 MG IJ SOLR: 40.0000 mg | Freq: Two times a day (BID) | INTRAMUSCULAR | Status: DC

## 2022-03-30 NOTE — Progress Notes (Signed)
Physical Therapy Treatment Patient Details Name: Michelle Shaffer MRN: [id number] DOB: [birth year] Today's Date: 03/30/2022   History of Present Illness Pt is an 81 yo female that presented to ED for respiratory distress. PMH of COPD on 3L, HTN, smoking, anxiety, depression.    PT Comments    Patient alert, agreeable to PT with some encouragement, was sleeping prior to PT arrival. Pt on 3L via [place] throughout, spO2 91% or greater. Pt noted to still have a significant cough. The patient was able to perform bed mobility with supervision. Sit <> stand with RW and CGA twice during session, 1 small LOB noted after second stand posteriorly, minA to correct. Pt able to ambulate ~35ft and then requested a seated rest break. Able to stand and march for a few more minutes after a break. Pt up in chair with all needs in reach at end of session. The patient would benefit from further skilled PT intervention to continue to progress towards goals. Recommendation remains appropriate.       Recommendations for follow up therapy are one component of a multi-disciplinary discharge planning process, led by the attending physician.  Recommendations may be updated based on patient status, additional functional criteria and insurance authorization.  Follow Up Recommendations  Home health PT     Assistance Recommended at Discharge Frequent or constant Supervision/Assistance  Patient can return home with the following A little help with walking and/or transfers;A little help with bathing/dressing/bathroom;Assistance with cooking/housework;Assistance with feeding;Assist for transportation;Help with stairs or ramp for entrance;Direct supervision/assist for medications management   Equipment Recommendations  Wheelchair cushion (measurements PT);Wheelchair (measurements PT)    Recommendations for Other Services       Precautions / Restrictions Precautions Precautions: Fall Restrictions Weight Bearing  Restrictions: No     Mobility  Bed Mobility   Bed Mobility: Supine to Sit     Supine to sit: Supervision, HOB elevated     General bed mobility comments: use of bed rails    Transfers Overall transfer level: Needs assistance Equipment used: Rolling walker (2 wheels) Transfers: Sit to/from Stand Sit to Stand: Min guard           General transfer comment: 1 small LOB after standing on second time, minA to correct    Ambulation/Gait Ambulation/Gait assistance: Min guard Gait Distance (Feet): 3 Feet Assistive device: Rolling walker (2 wheels)         General Gait Details: no LOB, but noted for fatigue   Stairs             Wheelchair Mobility    Modified Rankin (Stroke Patients Only)       Balance Overall balance assessment: Needs assistance Sitting-balance support: Feet supported Sitting balance-Leahy Scale: Good       Standing balance-Leahy Scale: Fair Standing balance comment: improvement with standing balance with at BUE support                            Cognition Arousal/Alertness: Awake/alert Behavior During Therapy: WFL for tasks assessed/performed Overall Cognitive Status: Within Functional Limits for tasks assessed                                          Exercises      General Comments        Pertinent Vitals/Pain Pain Assessment Pain Assessment: No/denies pain  Home Living                          Prior Function            PT Goals (current goals can now be found in the care plan section) Progress towards PT goals: Progressing toward goals    Frequency    Min 2X/week      PT Plan Current plan remains appropriate    Co-evaluation              AM-PAC PT "6 Clicks" Mobility   Outcome Measure  Help needed turning from your back to your side while in a flat bed without using bedrails?: A Little Help needed moving from lying on your back to sitting on the side of  a flat bed without using bedrails?: A Little Help needed moving to and from a bed to a chair (including a wheelchair)?: A Little Help needed standing up from a chair using your arms (e.g., wheelchair or bedside chair)?: A Little Help needed to walk in hospital room?: A Little Help needed climbing 3-5 steps with a railing? : A Lot 6 Click Score: 17    End of Session Equipment Utilized During Treatment: Gait belt Activity Tolerance: Patient tolerated treatment well Patient left: in bed;with call bell/phone within reach Nurse Communication: Mobility status PT Visit Diagnosis: Other abnormalities of gait and mobility (R26.89);Difficulty in walking, not elsewhere classified (R26.2);Muscle weakness (generalized) (M62.81)     Time: 1478-2956 PT Time Calculation (min) (ACUTE ONLY): 16 min  Charges:  $Therapeutic Activity: 8-22 mins                     Lieutenant Diego PT, DPT 1:57 PM,03/30/22

## 2022-03-30 NOTE — Progress Notes (Signed)
PHARMACIST - PHYSICIAN COMMUNICATION   CONCERNING: Methylprednisolone IV    Current order: Methylprednisolone IV 40mg  BID      DESCRIPTION: Per [facility] Protocol:   IV methylprednisolone will be converted to either a q12h or q24h frequency with the same total daily dose (TDD).  Ordered Dose: 1 to 125 mg TDD; convert to: TDD q24h.  Ordered Dose: 126 to 250 mg TDD; convert to: TDD div q12h.  Ordered Dose: >250 mg TDD; DAW.  Order has been adjusted to: Methylprednisolone IV 80mg  q24h   Darrick Penna Clinical Pharmacist 03/30/2022 2:42 PM

## 2022-03-30 NOTE — Progress Notes (Signed)
Occupational Therapy Treatment Patient Details Name: Michelle Shaffer MRN: [id number] DOB: 19-May-1941 Today's Date: 03/30/2022   History of present illness Pt is an 81 yo female that presented to ED for respiratory distress. PMH of COPD on 3L, HTN, smoking, anxiety, depression.   OT comments  Pt seen for OT tx this date. Pt sleeping, waking easily to OT's presence. Pt endorses recently out of bed and declines OOB. Agreeable to repositioning for improved lung support. Pt educated in use of bed for positioning changes, benefits of upright posture for lung support, and emphasized use of flutter valve frequently with cues for helping wiht adherence and establishing a routine around use. Pt completed repositioning into chair position with no direct assist but using bed positioning to help and VC for bed rail use to boost up. Pt continues to benefit from skilled OT Services to maximize safety/indep with ADL.    Recommendations for follow up therapy are one component of a multi-disciplinary discharge planning process, led by the attending physician.  Recommendations may be updated based on patient status, additional functional criteria and insurance authorization.    Follow Up Recommendations  Home health OT    Assistance Recommended at Discharge Frequent or constant Supervision/Assistance  Patient can return home with the following  A little help with walking and/or transfers;A little help with bathing/dressing/bathroom;Assistance with cooking/housework   Equipment Recommendations  None recommended by OT    Recommendations for Other Services      Precautions / Restrictions Precautions Precautions: Fall Restrictions Weight Bearing Restrictions: No       Mobility Bed Mobility               General bed mobility comments: pt educated in bed positioning and use of bed rails to help herself reposition and boost up in the bed.    Transfers                   General transfer  comment: declined, recently OOB     Balance                                           ADL either performed or assessed with clinical judgement   ADL                                              Extremity/Trunk Assessment              Vision       Perception     Praxis      Cognition Arousal/Alertness: Awake/alert Behavior During Therapy: WFL for tasks assessed/performed Overall Cognitive Status: Within Functional Limits for tasks assessed                                          Exercises Other Exercises Other Exercises: Pt educated in use of bed for positioning changes, benefits of  upright posture for lung support, and emphasized use of flutter valve frequently with cues for helping wiht adherence and establishing a routine around use.    Shoulder Instructions       General Comments      Pertinent Vitals/ Pain  Pain Assessment Pain Assessment: No/denies pain  Home Living                                          Prior Functioning/Environment              Frequency  Min 2X/week        Progress Toward Goals  OT Goals(current goals can now be found in the care plan section)  Progress towards OT goals: Progressing toward goals  Acute Rehab OT Goals Patient Stated Goal: to go home wiht family OT Goal Formulation: With patient Time For Goal Achievement: 04/11/22 Potential to Achieve Goals: Good  Plan Discharge plan remains appropriate;Frequency remains appropriate    Co-evaluation                 AM-PAC OT "6 Clicks" Daily Activity     Outcome Measure   Help from another person eating meals?: None Help from another person taking care of personal grooming?: A Little Help from another person toileting, which includes using toliet, bedpan, or urinal?: A Little Help from another person bathing (including washing, rinsing, drying)?: A Little Help from another  person to put on and taking off regular upper body clothing?: None Help from another person to put on and taking off regular lower body clothing?: A Little 6 Click Score: 20    End of Session Equipment Utilized During Treatment: Oxygen  OT Visit Diagnosis: Muscle weakness (generalized) (M62.81);Unsteadiness on feet (R26.81)   Activity Tolerance Patient tolerated treatment well   Patient Left in bed;with call bell/phone within reach;with bed alarm set   Nurse Communication          Time: 2080-2233 OT Time Calculation (min): 14 min  Charges: OT General Charges $OT Visit: 1 Visit OT Treatments $Therapeutic Activity: 8-22 mins  Ardeth Perfect., MPH, MS, OTR/L ascom [phone] 03/30/22, 3:00 PM

## 2022-03-30 NOTE — Care Management Important Message (Signed)
Important Message  Patient Details  Name: Michelle Shaffer MRN: [id number] Date of Birth: 05/17/1941   Medicare Important Message Given:  Yes  Patient asleep upon time of visit.  Copy of Medicare IM left in patient's room for reference.    Dannette Barbara 03/30/2022, 12:26 PM

## 2022-03-30 NOTE — Progress Notes (Signed)
PROGRESS NOTE Michelle Shaffer  ZDG:[id number] DOB: 12/26/1940 DOA: 03/26/2022 PCP: Medicine, [employer]   Brief Narrative/Hospital Course: 81 year old female with past medical history of hypertension, tobacco abuse, obesity and 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  Patient with productive cough and oxygen saturations noted to be at 71% on 3 L requiring nonrebreather.  Patient found to be quite dyspneic and wheezing and felt to be in COPD exacerbation.  She was given nebulizers and Solu-Medrol.  COVID and flu tests negative.  Chest x-ray noted emphysema, but no focal consolidation.  BNP and procalcitonin also normal.  Patient brought in for further evaluation- started on management for COPD exacerbation with Solu-Medrol, nebulizers for management acute exacerbation of COPD, acute on chronic hypoxic respiratory failure.    Subjective: Seen and examined this morning.  Complains of ongoing wheezing cough Oxygen requirement down to 3 L which is her home setting   Assessment and Plan: Principal Problem:   COPD with acute exacerbation ([place]) Active Problems:   Anxiety   Essential (primary) hypertension   Depression   Tobacco use disorder   Obesity (BMI 30-39.9)   Acute exacerbation of COPD Acute on chronic hypoxic respiratory failure-at baseline on 3 L [place] Current smoker: Patient admitted with respiratory distress wheezing productive cough needing nonrebreather initially.  Work-up unremarkable for pneumonia, BNP procalcitonin stable.  Complains of ongoing wheezing, cough.  Oxygen requirement down to 3 L home setting. Continue with Solu-Medrol at least 1 more day, continue bronchodilators Mucinex, doxycycline.  S/P 1 dose Lasix 10/15> echo ordered to rule out CHF.  Continue to encourage smoking  cessation.  Essential hypertension: Well-controlled on home amlodipine and lisinopril Anxiety/depression on Klonopin and trazodone GERD on PPI Neuropathy on Neurontin Class I Obesity:Patient's Body mass index is 30.36 kg/m. : Will benefit with PCP follow-up, weight loss  healthy lifestyle and outpatient sleep evaluation.  DVT prophylaxis: enoxaparin (LOVENOX) injection 40 mg Start: 03/26/22 0800 Code Status:   Code Status: Full Code Family Communication: plan of care discussed with patient at bedside. Patient status is: Inpatient because of acute COPD exacerbation Level of care: Med-Surg   Dispo: The patient is from: Home            Anticipated disposition: Home in next 1 to 2 days  Objective: Vitals last 24 hrs: Vitals:   03/30/22 0216 03/30/22 0540 03/30/22 0733 03/30/22 0753  BP:  114/69  128/64  Pulse:  89 87 95  Resp:  20 18 16   Temp:  98.5 F (36.9 C)  98.2 F (36.8 C)  TempSrc:  Oral    SpO2: 94% 93% 94% 96%  Weight:      Height:      Physical Examination: General exam: alert awake, older than stated age HEENT:Oral mucosa moist, Ear/Nose WNL grossly Respiratory system: bilaterally air entry present with diffuse expiratory wheezing BS, no use of accessory muscle Cardiovascular system: S1 & S2 +, No JVD. Gastrointestinal system: Abdomen soft,NT,ND, BS+ Nervous System:Alert, awake, moving extremities. Extremities: LE edema neg,distal peripheral pulses palpable.  Skin: No rashes,no icterus. MSK: Normal muscle bulk,tone, power  Medications reviewed:  Scheduled Meds:  amLODipine  5 mg Oral Daily   chlorpheniramine-HYDROcodone  5 mL Oral QHS   doxycycline  100 mg Oral Q12H   enoxaparin (LOVENOX) injection  40 mg Subcutaneous Q24H  gabapentin  300 mg Oral TID   guaiFENesin  1,200 mg Oral BID   ipratropium-albuterol  3 mL Nebulization Q6H   lisinopril  20 mg Oral Daily   nicotine  14 mg Transdermal Daily   pantoprazole  40 mg Oral Daily   traZODone  25 mg Oral  QHS  Continuous Infusions:   Diet Order             Diet Heart Room service appropriate? Yes; Fluid consistency: Thin  Diet effective now                  Intake/Output Summary (Last 24 hours) at 03/30/2022 1306 Last data filed at 03/30/2022 9323 Gross per 24 hour  Intake --  Output 2800 ml  Net -2800 ml   Net IO Since Admission: -4,260 mL [03/30/22 1306]  Wt Readings from Last 3 Encounters:  03/26/22 75.3 kg     Unresulted Labs (From admission, onward)     Start     Ordered   04/02/22 0500  Creatinine, serum  (enoxaparin (LOVENOX)    CrCl >/= 30 ml/min)  Weekly,   R     Comments: while on enoxaparin therapy    03/26/22 0341          Data Reviewed: I have personally reviewed following labs and imaging studies CBC: Recent Labs  Lab 03/26/22 0130 03/27/22 0433 03/30/22 0429  WBC 14.9* 19.9* 13.1*  NEUTROABS 11.5*  --  10.5*  HGB 16.1* 14.1 13.9  HCT 50.9* 44.3 43.8  MCV 93.6 92.7 93.8  PLT 265 241 264   Basic Metabolic Panel:   Antimicrobials: Anti-infectives (From admission, onward)    Start     Dose/Rate Route Frequency Ordered Stop   03/27/22 1530  doxycycline (VIBRA-TABS) tablet 100 mg        100 mg Oral Every 12 hours 03/27/22 1441 04/01/22 0959      Culture/Microbiology No results found for: "SDES", "SPECREQUEST", "CULT", "REPTSTATUS"  Other culture-see note  Radiology Studies: No results found.   LOS: 3 days   Lanae Boast, MD [facility]  03/30/2022, 1:06 PM

## 2022-03-31 ENCOUNTER — Inpatient Hospital Stay: Admit: 2022-03-31 | Payer: Medicare Other

## 2022-03-31 DIAGNOSIS — F17200 Nicotine dependence, unspecified, uncomplicated: Secondary | ICD-10-CM

## 2022-03-31 DIAGNOSIS — J9601 Acute respiratory failure with hypoxia: Secondary | ICD-10-CM

## 2022-03-31 MED ORDER — GUAIFENESIN ER 600 MG PO TB12: 1200.0000 mg | ORAL_TABLET | Freq: Two times a day (BID) | ORAL | 0 refills | Status: AC

## 2022-03-31 MED ORDER — DOXYCYCLINE HYCLATE 100 MG PO TABS: 100.0000 mg | ORAL_TABLET | Freq: Two times a day (BID) | ORAL | 0 refills | Status: AC

## 2022-03-31 MED ORDER — NICOTINE 14 MG/24HR TD PT24: 14.0000 mg | MEDICATED_PATCH | Freq: Every day | TRANSDERMAL | 0 refills | Status: AC

## 2022-03-31 MED ORDER — HYDROCOD POLI-CHLORPHE POLI ER 10-8 MG/5ML PO SUER: 5.0000 mL | Freq: Every day | ORAL | 0 refills | Status: AC

## 2022-03-31 NOTE — Progress Notes (Signed)
Mobility Specialist - Progress Note  Pre-mobility: SpO2 94% ([place])   03/31/22 1157  Mobility  Activity Ambulated with assistance to bathroom  Level of Assistance Standby assist, set-up cues, supervision of patient - no hands on  Assistive Device Front wheel walker  Distance Ambulated (ft) 10 ft  Activity Response Tolerated well  Mobility Referral Yes  $Mobility charge 1 Mobility   Pt supine upon entry, utilizing 2L . Pt STS to RW and ambulation supervision. Pt ambulated to the bathroom using RW, tolerated well. Pt denied SOB and dizziness during ambulation

## 2022-03-31 NOTE — TOC Transition Note (Signed)
Transition of Care [facility]) - CM/SW Discharge Note   Patient Details  Name: Stasha Naraine MRN: [id number] Date of Birth: 10/06/1940  Transition of Care [facility]) CM/SW Contact:  Beverly Sessions, RN Phone Number: 03/31/2022, 11:55 AM   Clinical Narrative:      Patient to discharge today Family at bedside.  Family to transport today and will bring portable O2 for transport home WC to be delivered to room by Faythe Dingwall with adapt  Corene Cornea with [facility] notified of discharge  Final next level of care: [place] Barriers to Discharge: Continued Medical Work up   Patient Goals and CMS Choice Patient states their goals for this hospitalization and ongoing recovery are:: Go home CMS Medicare.gov Compare Post Acute Care list provided to:: Patient Choice offered to / list presented to : Patient  Discharge Placement                       Discharge Plan and Services   Discharge Planning Services: CM Consult                      HH Arranged: PT          Social Determinants of Health (SDOH) Interventions     Readmission Risk Interventions     No data to display

## 2022-03-31 NOTE — Progress Notes (Signed)
Mobility Specialist - Progress Note   Pre-mobility: HR, BP, SpO2 During mobility: HR, BP, SpO2 Post-mobility: HR, BP, SPO2     03/31/22 1157  Mobility  Activity Ambulated with assistance to bathroom  Level of Assistance Standby assist, set-up cues, supervision of patient - no hands on  Assistive Device Front wheel walker  Distance Ambulated (ft) 10 ft  Activity Response Tolerated well  Mobility Referral Yes  $Mobility charge 1 Mobility   Pt supine upon entry, utilizing [employer] 2L. Pt STS to RW and AMB supervision. Pt ambulated to the bathroom and back using RW, tolerated well. Pt denied SOB, however voiced concerns of "slight" dizziness once returned to bed. Pt left supine with needs within reach.  Candie [place] Mobility Specialist 03/31/22 12:24 PM

## 2022-03-31 NOTE — Plan of Care (Signed)

## 2022-04-01 LAB — BLOOD GAS, ARTERIAL
Acid-base deficit: 0.8 mmol/L (ref 0.0–2.0)
Bicarbonate: 24.3 mmol/L (ref 20.0–28.0)
O2 Content: 2 L/min
O2 Saturation: 93.6 %
Patient temperature: 37
pCO2 arterial: 41 mmHg (ref 32–48)
pH, Arterial: 7.38 (ref 7.35–7.45)
pO2, Arterial: 62 mmHg — ABNORMAL LOW (ref 83–108)

## 2022-04-02 NOTE — Discharge Summary (Signed)
Physician Discharge Summary   Patient: Michelle Shaffer MRN: [id number] DOB: [birth year]  Admit date:     03/26/2022  Discharge date: 03/31/2022  Discharge Physician: Delfino Lovett   PCP: [facility] [employer]   Recommendations at discharge:   Follow-up with outpatient providers as requested  Discharge Diagnoses: Principal Problem:   COPD exacerbation (HCC) Active Problems:   Acute respiratory failure with hypoxia (HCC)   Anxiety   Essential (primary) hypertension   Depression   Tobacco use disorder   Obesity (BMI 30-39.9)  Hospital Course: 81 year old female with past medical history of hypertension, tobacco abuse, obesity and 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  Patient with productive cough and oxygen saturations noted to be at 71% on 3 L requiring nonrebreather.  Patient found to be quite dyspneic and wheezing and felt to be in COPD exacerbation.  She was given nebulizers and Solu-Medrol.  COVID and flu tests negative.  Chest x-ray noted emphysema, but no focal consolidation.  BNP and procalcitonin also normal.  Patient brought in for further evaluation- started on management for COPD exacerbation with Solu-Medrol, nebulizers for management acute exacerbation of COPD, acute on chronic hypoxic respiratory failure.  Assessment and Plan: * COPD exacerbation (HCC) Improved with steroids, inhalers and nebulizers.  Oxygen weaned down to her baseline on the day of DC.  Follow-up with outpatient pulmonary physician at [facility]  Acute respiratory failure with hypoxia [facility]) Patient normally on 3 L nasal cannula.  Initially required higher oxygen but now back to her baseline  Anxiety Essential (primary) hypertension Depression Tobacco use disorder Obesity (BMI 30-39.9) Meets criteria BMI greater than  30          Disposition: Home Diet recommendation:  Discharge Diet Orders (From admission, onward)     Start     Ordered   03/31/22 0000  Diet - low sodium heart healthy        03/31/22 1107           Carb modified diet DISCHARGE MEDICATION: Allergies as of 03/31/2022       Reactions   Aspirin    Stomach nausea        Medication List     TAKE these medications    albuterol 108 (90 Base) MCG/ACT inhaler Commonly known as: VENTOLIN HFA Inhale 2 puffs into the lungs every 6 (six) hours as needed.   alendronate 70 MG tablet Commonly known as: FOSAMAX Take 70 mg by mouth once a week.   amLODipine 5 MG tablet Commonly known as: NORVASC Take 5 mg by mouth daily.   chlorpheniramine-HYDROcodone 10-8 MG/5ML Commonly known as: TUSSIONEX Take 5 mLs by mouth at bedtime for 5 days.   clonazePAM 0.5 MG tablet Commonly known as: KLONOPIN Take 0.5 mg by mouth at bedtime as needed.   cyclobenzaprine 5 MG tablet Commonly known as: FLEXERIL Take 5 mg by mouth at bedtime as needed.   doxycycline 100 MG tablet Commonly known as: VIBRA-TABS Take 1 tablet (100 mg total) by mouth every 12 (twelve) hours for 5 days.   FLUoxetine 40 MG capsule Commonly known as: PROZAC Take 40 mg by mouth.   gabapentin 300 MG capsule Commonly known as: NEURONTIN Take 300 mg by mouth 3 (three) times daily.   guaiFENesin 600 MG 12 hr tablet Commonly known as: MUCINEX  Take 2 tablets (1,200 mg total) by mouth 2 (two) times daily for 5 days.   lisinopril 20 MG tablet Commonly known as: ZESTRIL Take 20 mg by mouth daily.   nicotine 14 mg/24hr patch Commonly known as: NICODERM CQ - dosed in mg/24 hours Place 1 patch (14 mg total) onto the skin daily.   omeprazole 20 MG capsule Commonly known as: PRILOSEC Take 20 mg by mouth daily.   traZODone 50 MG tablet Commonly known as: DESYREL Take 25 mg by mouth at bedtime.   Vitamin D3 1.25 MG (50000 UT) Tabs Take by mouth once a  week.        Follow-up Information     [facility], [employer]. Schedule an appointment as soon as possible for a visit in 3 day(s).   Why: Call to make appointment.  [facility] Discharge F/UP Contact information: [address] [place] [address] [phone]         Ellin Mayhew, MD. Schedule an appointment as soon as possible for a visit in 1 week(s).   Specialty: Internal Medicine Why: call to make appointment.  [facility] Discharge F/UP Contact information: [address] [place] [address] [phone]                Discharge Exam: Ceasar Mons Weights   03/26/22 0123  Weight: 1.16 kg   81 year old female lying in the bed comfortably without any acute distress Lungs clear to auscultation bilaterally, decreased breath sounds at the bases Heart regular rate and rhythm Abdomen soft, benign Neuro alert and awake, nonfocal Skin no rash or lesion  Condition at discharge: fair  The results of significant diagnostics from this hospitalization (including imaging, microbiology, ancillary and laboratory) are listed below for reference.   Imaging Studies: DG Chest Portable 1 View  Result Date: 03/26/2022 CLINICAL DATA:  COPD exacerbation. Respiratory distress. Current smoker. EXAM: PORTABLE CHEST 1 VIEW COMPARISON:  None Available. FINDINGS: Heart size and pulmonary vascularity are normal. Emphysematous changes and scattered fibrosis in the lungs. Peribronchial thickening suggesting chronic bronchitis. No airspace disease or consolidation in the lungs. No pleural effusions. No pneumothorax. Mediastinal contours appear intact. Calcification of the aorta. IMPRESSION: Emphysematous and chronic bronchitic changes in the lungs. No focal consolidation. Electronically Signed   By: Burman Nieves M.D.   On: 03/26/2022 01:46    Microbiology: Results for orders placed or performed during the hospital encounter of 03/26/22  Resp Panel  by RT-PCR (Flu A&B, Covid) Anterior Nasal Swab     Status: None   Collection Time: 03/26/22  4:02 AM   Specimen: Anterior Nasal Swab  Result Value Ref Range Status   SARS Coronavirus 2 by RT PCR NEGATIVE NEGATIVE Final    Comment: (NOTE) SARS-CoV-2 target nucleic acids are NOT DETECTED.  The SARS-CoV-2 RNA is generally detectable in upper respiratory specimens during the acute phase of infection. The lowest concentration of SARS-CoV-2 viral copies this assay can detect is 138 copies/mL. A negative result does not preclude SARS-Cov-2 infection and should not be used as the sole basis for treatment or other patient management decisions. A negative result may occur with  improper specimen collection/handling, submission of specimen other than nasopharyngeal swab, presence of viral mutation(s) within the areas targeted by this assay, and inadequate number of viral copies(<138 copies/mL). A negative result must be combined with clinical observations, patient history, and epidemiological information. The expected result is Negative.  Fact Sheet for Patients:  BloggerCourse.com  Fact  Sheet for Healthcare Providers:  IncredibleEmployment.be  This test is no t yet approved or cleared by the [place] FDA and  has been authorized for detection and/or diagnosis of SARS-CoV-2 by FDA under an Emergency Use Authorization (EUA). This EUA will remain  in effect (meaning this test can be used) for the duration of the COVID-19 declaration under Section 564(b)(1) of the Act, 21 U.S.C.section 360bbb-3(b)(1), unless the authorization is terminated  or revoked sooner.       Influenza A by PCR NEGATIVE NEGATIVE Final   Influenza B by PCR NEGATIVE NEGATIVE Final    Comment: (NOTE) The Xpert Xpress SARS-CoV-2/FLU/RSV plus assay is intended as an aid in the diagnosis of influenza from Nasopharyngeal swab specimens and should not be used as a sole basis  for treatment. Nasal washings and aspirates are unacceptable for Xpert Xpress SARS-CoV-2/FLU/RSV testing.  Fact Sheet for Patients: EntrepreneurPulse.com.au  Fact Sheet for Healthcare Providers: IncredibleEmployment.be  This test is not yet approved or cleared by the [place] FDA and has been authorized for detection and/or diagnosis of SARS-CoV-2 by FDA under an Emergency Use Authorization (EUA). This EUA will remain in effect (meaning this test can be used) for the duration of the COVID-19 declaration under Section 564(b)(1) of the Act, 21 U.S.C. section 360bbb-3(b)(1), unless the authorization is terminated or revoked.  Performed at [facility], [address] [place], [place] [address]     Labs: CBC: Recent Labs  Lab 03/27/22 0433 03/30/22 0429  WBC 19.9* 13.1*  NEUTROABS  --  10.5*  HGB 14.1 13.9  HCT 44.3 43.8  MCV 92.7 93.8  PLT 241 732   Basic Metabolic Panel: Recent Labs  Lab 03/30/22 0429  NA 138  K 4.3  CL 103  CO2 29  GLUCOSE 103*  BUN 27*  CREATININE 0.90  CALCIUM 9.0   Liver Function Tests: No results for input(s): "AST", "ALT", "ALKPHOS", "BILITOT", "PROT", "ALBUMIN" in the last 168 hours. CBG: No results for input(s): "GLUCAP" in the last 168 hours.  Discharge time spent: greater than 30 minutes.  Signed: Max Sane, MD [facility] 04/02/2022
